# Patient Record
Sex: Female | Born: 1945 | Race: White | Hispanic: No | Smoking: Never smoker
Health system: Southern US, Community
[De-identification: ages and names within clinical notes are randomized; demographics above are authoritative.]

## PROBLEM LIST (undated history)

## (undated) DIAGNOSIS — E079 Disorder of thyroid, unspecified: Secondary | ICD-10-CM

## (undated) DIAGNOSIS — I10 Essential (primary) hypertension: Secondary | ICD-10-CM

## (undated) DIAGNOSIS — F419 Anxiety disorder, unspecified: Secondary | ICD-10-CM

## (undated) DIAGNOSIS — T7840XA Allergy, unspecified, initial encounter: Secondary | ICD-10-CM

## (undated) DIAGNOSIS — H269 Unspecified cataract: Secondary | ICD-10-CM

## (undated) DIAGNOSIS — M810 Age-related osteoporosis without current pathological fracture: Secondary | ICD-10-CM

## (undated) HISTORY — DX: Age-related osteoporosis without current pathological fracture: M81.0

## (undated) HISTORY — PX: EYE SURGERY: SHX253

## (undated) HISTORY — DX: Essential (primary) hypertension: I10

## (undated) HISTORY — DX: Unspecified cataract: H26.9

## (undated) HISTORY — DX: Allergy, unspecified, initial encounter: T78.40XA

## (undated) HISTORY — DX: Anxiety disorder, unspecified: F41.9

## (undated) HISTORY — DX: Disorder of thyroid, unspecified: E07.9

---

## 2010-10-21 ENCOUNTER — Encounter: Payer: Self-pay | Admitting: Gastroenterology

## 2016-06-24 ENCOUNTER — Other Ambulatory Visit: Payer: Self-pay | Admitting: Internal Medicine

## 2016-06-24 DIAGNOSIS — G8929 Other chronic pain: Secondary | ICD-10-CM

## 2016-06-24 DIAGNOSIS — M25512 Pain in left shoulder: Principal | ICD-10-CM

## 2016-06-24 DIAGNOSIS — M25511 Pain in right shoulder: Secondary | ICD-10-CM

## 2016-06-27 ENCOUNTER — Other Ambulatory Visit: Payer: Self-pay | Admitting: Internal Medicine

## 2016-06-27 DIAGNOSIS — M8580 Other specified disorders of bone density and structure, unspecified site: Secondary | ICD-10-CM

## 2016-07-01 ENCOUNTER — Ambulatory Visit
Admission: RE | Admit: 2016-07-01 | Discharge: 2016-07-01 | Disposition: A | Discharge status: Home / Self Care | Payer: Medicare Other | Source: Ambulatory Visit | Attending: Internal Medicine | Admitting: Internal Medicine

## 2016-07-01 ENCOUNTER — Other Ambulatory Visit: Payer: Self-pay | Admitting: Internal Medicine

## 2016-07-01 DIAGNOSIS — M79602 Pain in left arm: Secondary | ICD-10-CM

## 2016-07-01 DIAGNOSIS — M8580 Other specified disorders of bone density and structure, unspecified site: Secondary | ICD-10-CM

## 2016-07-01 DIAGNOSIS — G8929 Other chronic pain: Secondary | ICD-10-CM | POA: Diagnosis not present

## 2016-07-01 DIAGNOSIS — M79601 Pain in right arm: Secondary | ICD-10-CM | POA: Diagnosis not present

## 2016-07-01 DIAGNOSIS — Z780 Asymptomatic menopausal state: Secondary | ICD-10-CM | POA: Diagnosis not present

## 2016-07-01 DIAGNOSIS — M25512 Pain in left shoulder: Principal | ICD-10-CM

## 2016-07-01 DIAGNOSIS — M810 Age-related osteoporosis without current pathological fracture: Secondary | ICD-10-CM | POA: Insufficient documentation

## 2016-07-01 DIAGNOSIS — M25511 Pain in right shoulder: Secondary | ICD-10-CM | POA: Insufficient documentation

## 2016-07-16 DIAGNOSIS — C4492 Squamous cell carcinoma of skin, unspecified: Secondary | ICD-10-CM | POA: Insufficient documentation

## 2018-02-10 HISTORY — PX: CORNEAL TRANSPLANT: SHX108

## 2018-04-26 IMAGING — CR DG SHOULDER 2+V*L*
3 series · 3 of 3 positions shown · non-contrast
Comparison: None.

CLINICAL DATA: 70-year-old female bilateral shoulder pain for 6
months. No known injury. Pain proximal humerus extending into
biceps. Hurts to left arms. Clicking sensation left elbow up with
some pain right-side. Initial encounter.

EXAM:
LEFT SHOULDER - 2+ VIEW

[shoulder grashey]
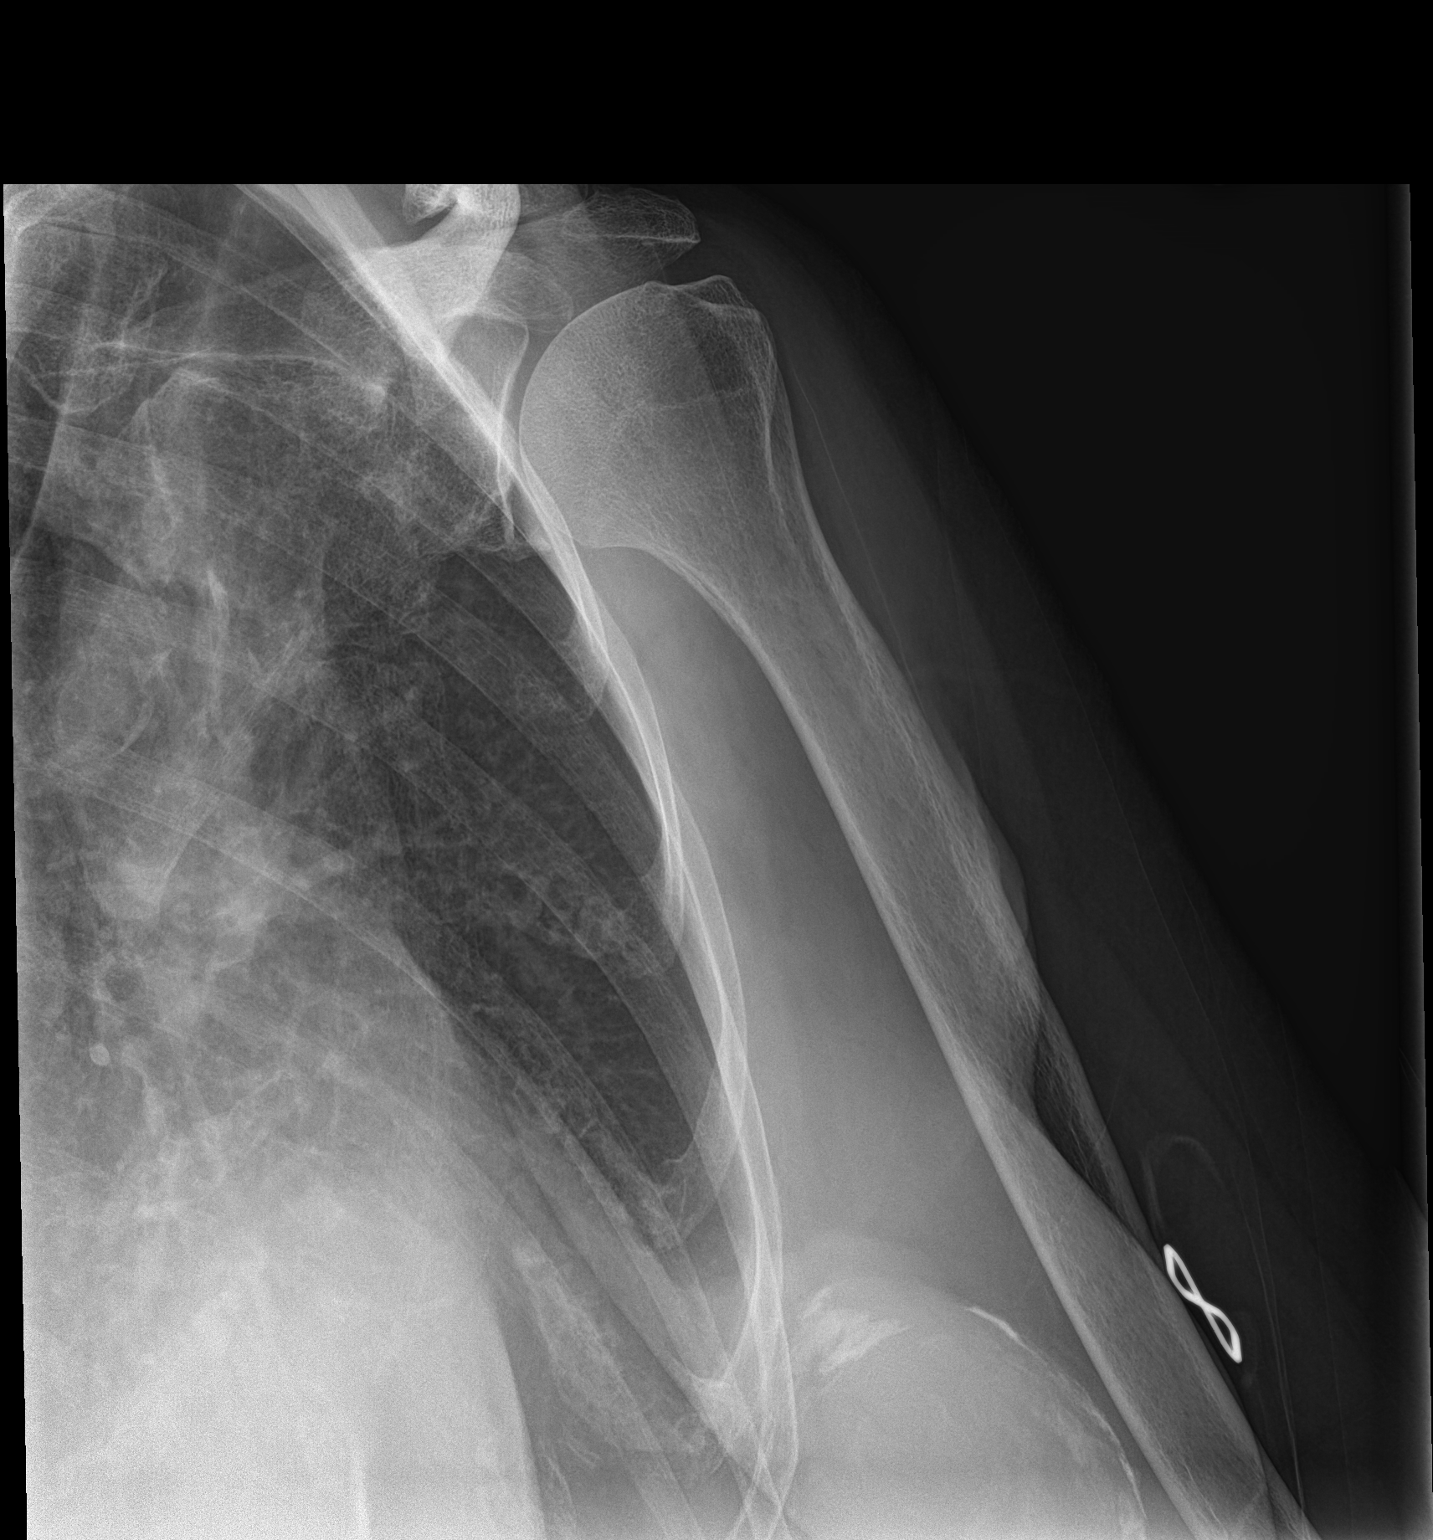

[shoulder y view]
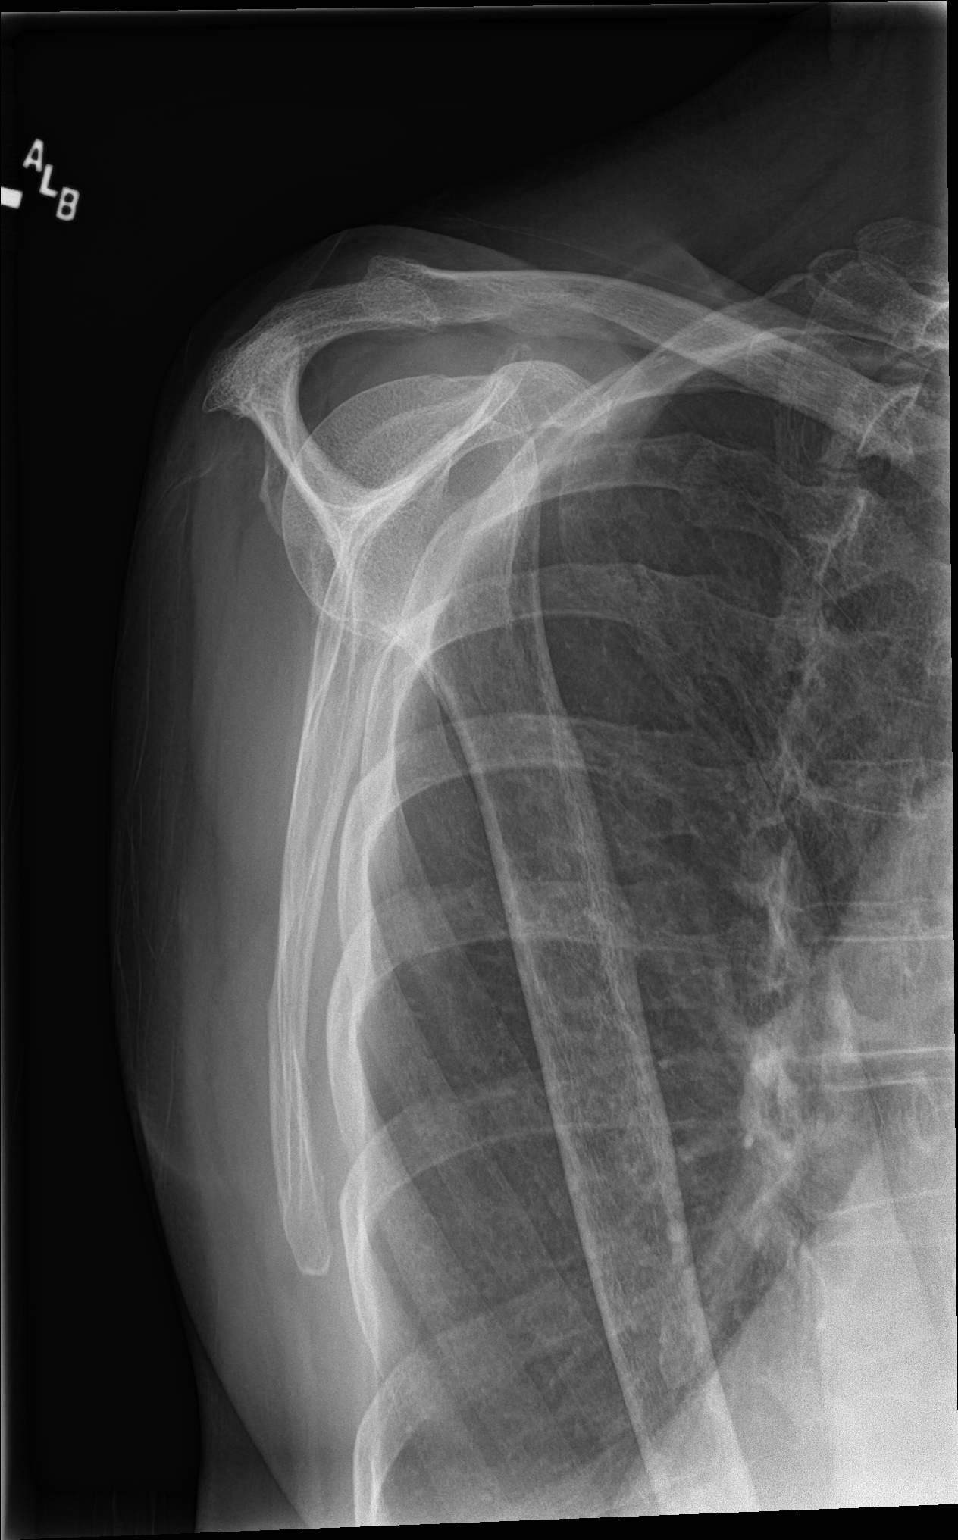

[shoulder axial]
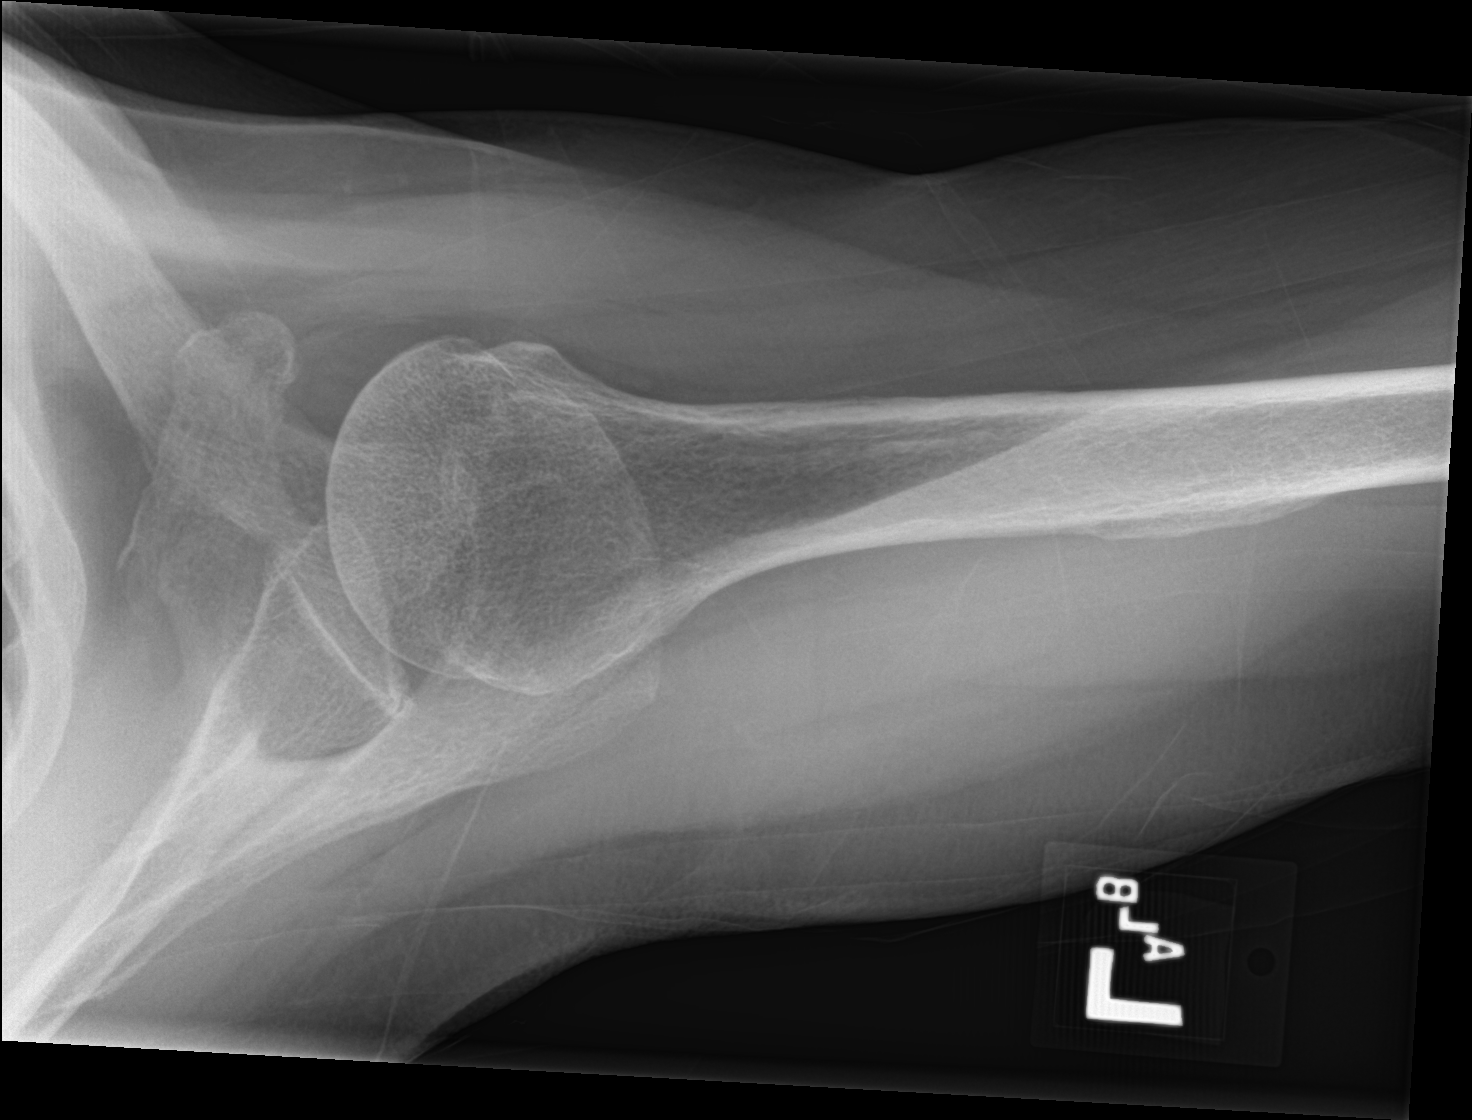

[3 of 3 positions shown; findings below may reference images not displayed]

FINDINGS: No fracture or dislocation.

Mild acromioclavicular joint degenerative changes.

No osseous destructive lesion.

No soft tissue calcifications shoulder level.

Calcified breast prosthesis suspected.

Visualized lungs clear.
IMPRESSION: Mild left acromioclavicular joint degenerative changes.

## 2018-04-26 IMAGING — CR DG ELBOW COMPLETE 3+V*L*
4 series · 4 of 4 positions shown · non-contrast
Comparison: None.

CLINICAL DATA: 70-year-old female bilateral shoulder pain for 6
months. No known injury. Pain proximal humerus extending into
biceps. Hurts to lift arms. Clicking sensation left elbow up with
some pain right-side. Initial encounter.

EXAM:
LEFT ELBOW - COMPLETE 3+ VIEW

[elbow ap]
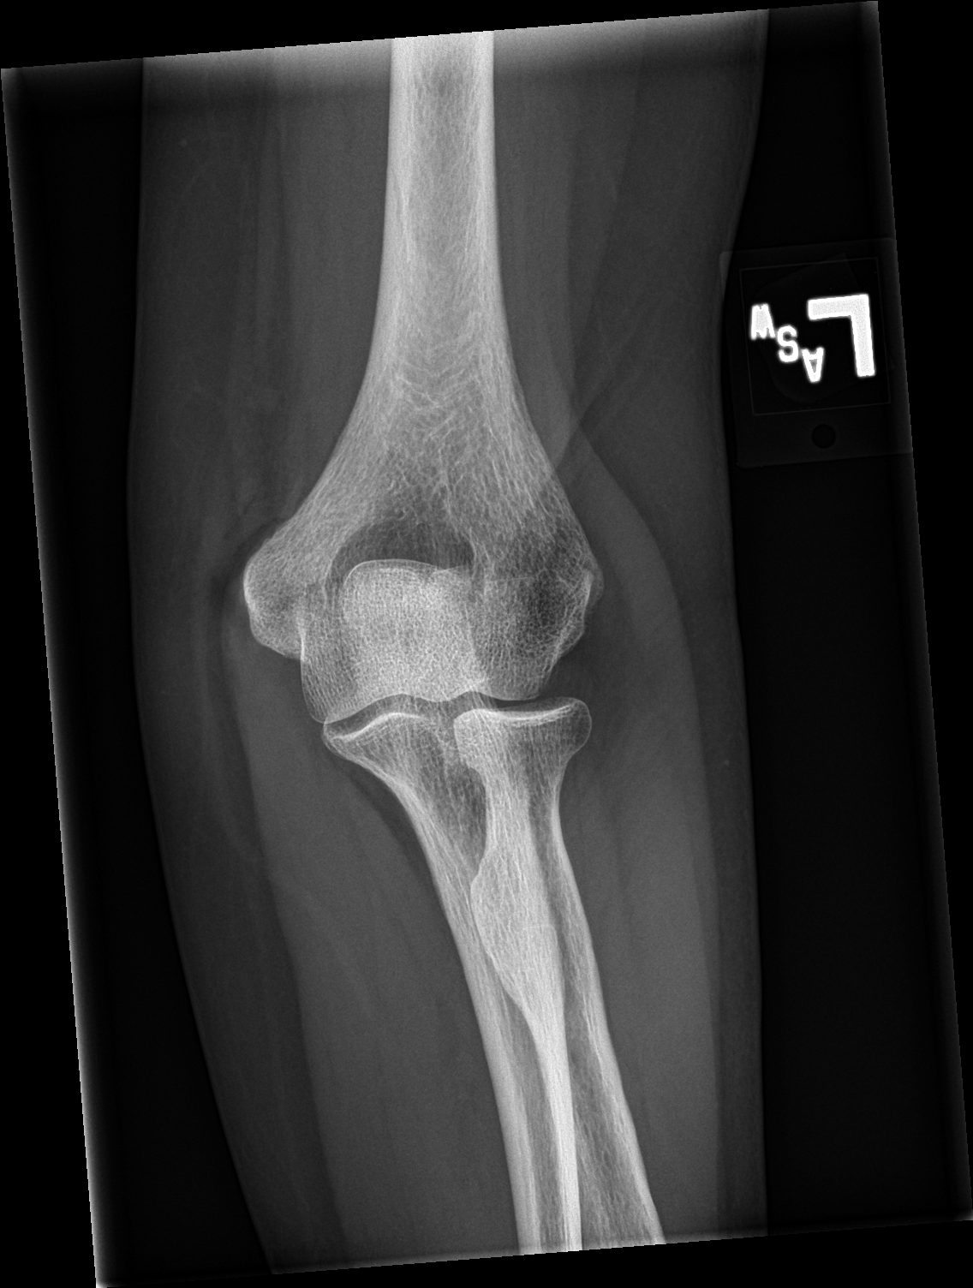

[elbow obl (1 of 2)]
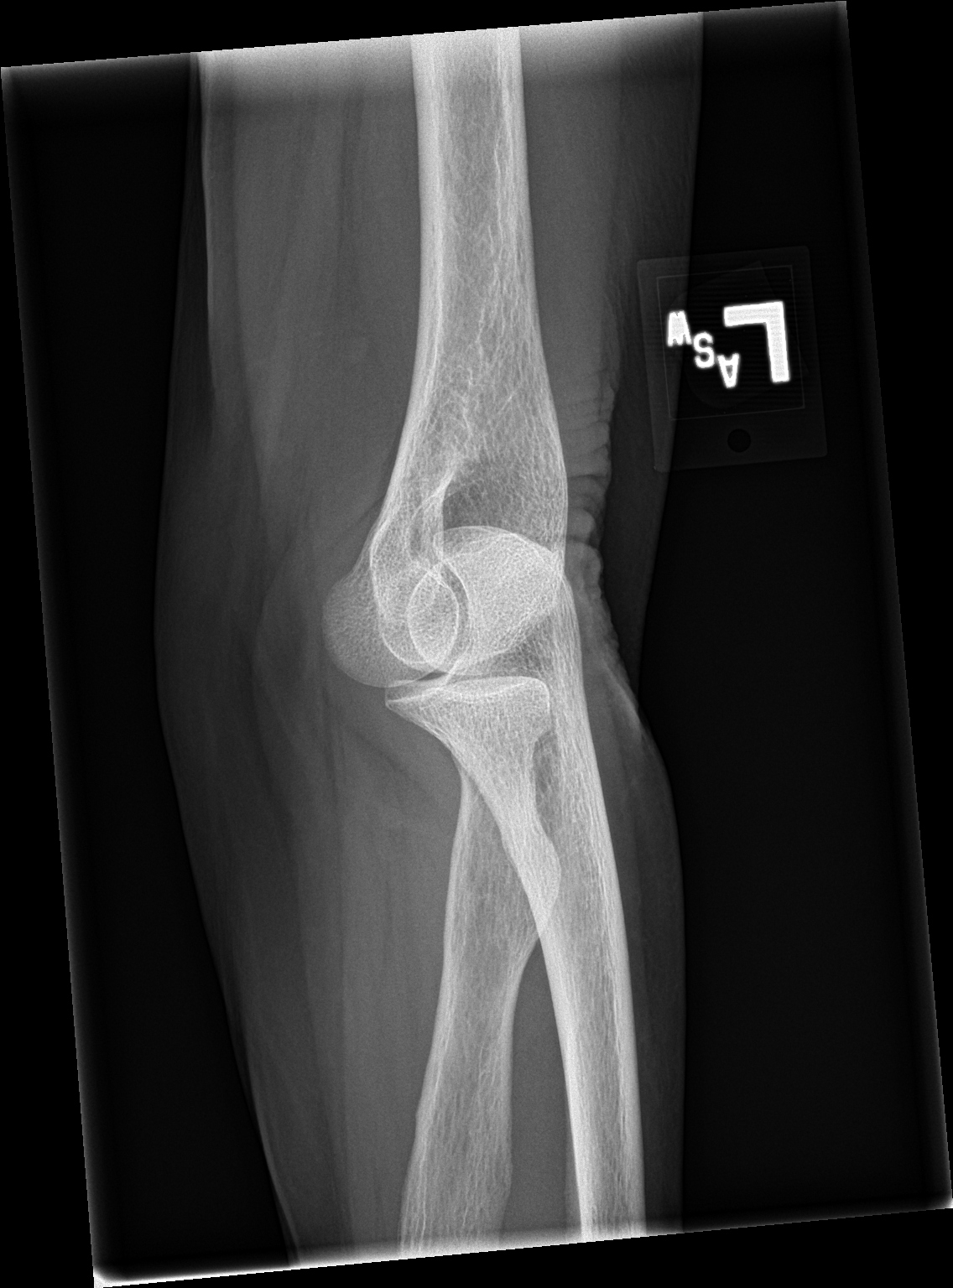

[elbow obl (2 of 2)]
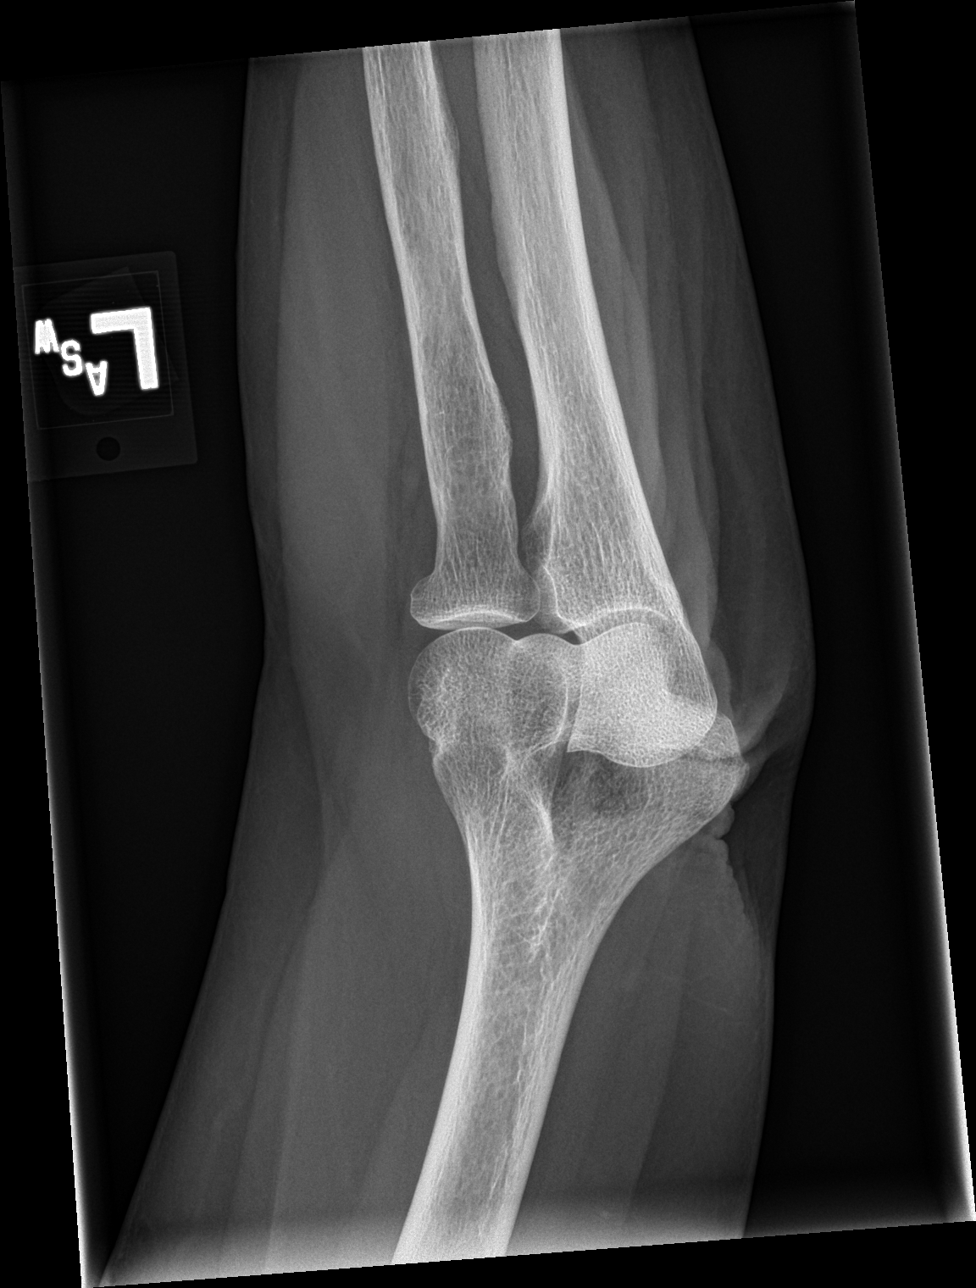

[elbow lat]
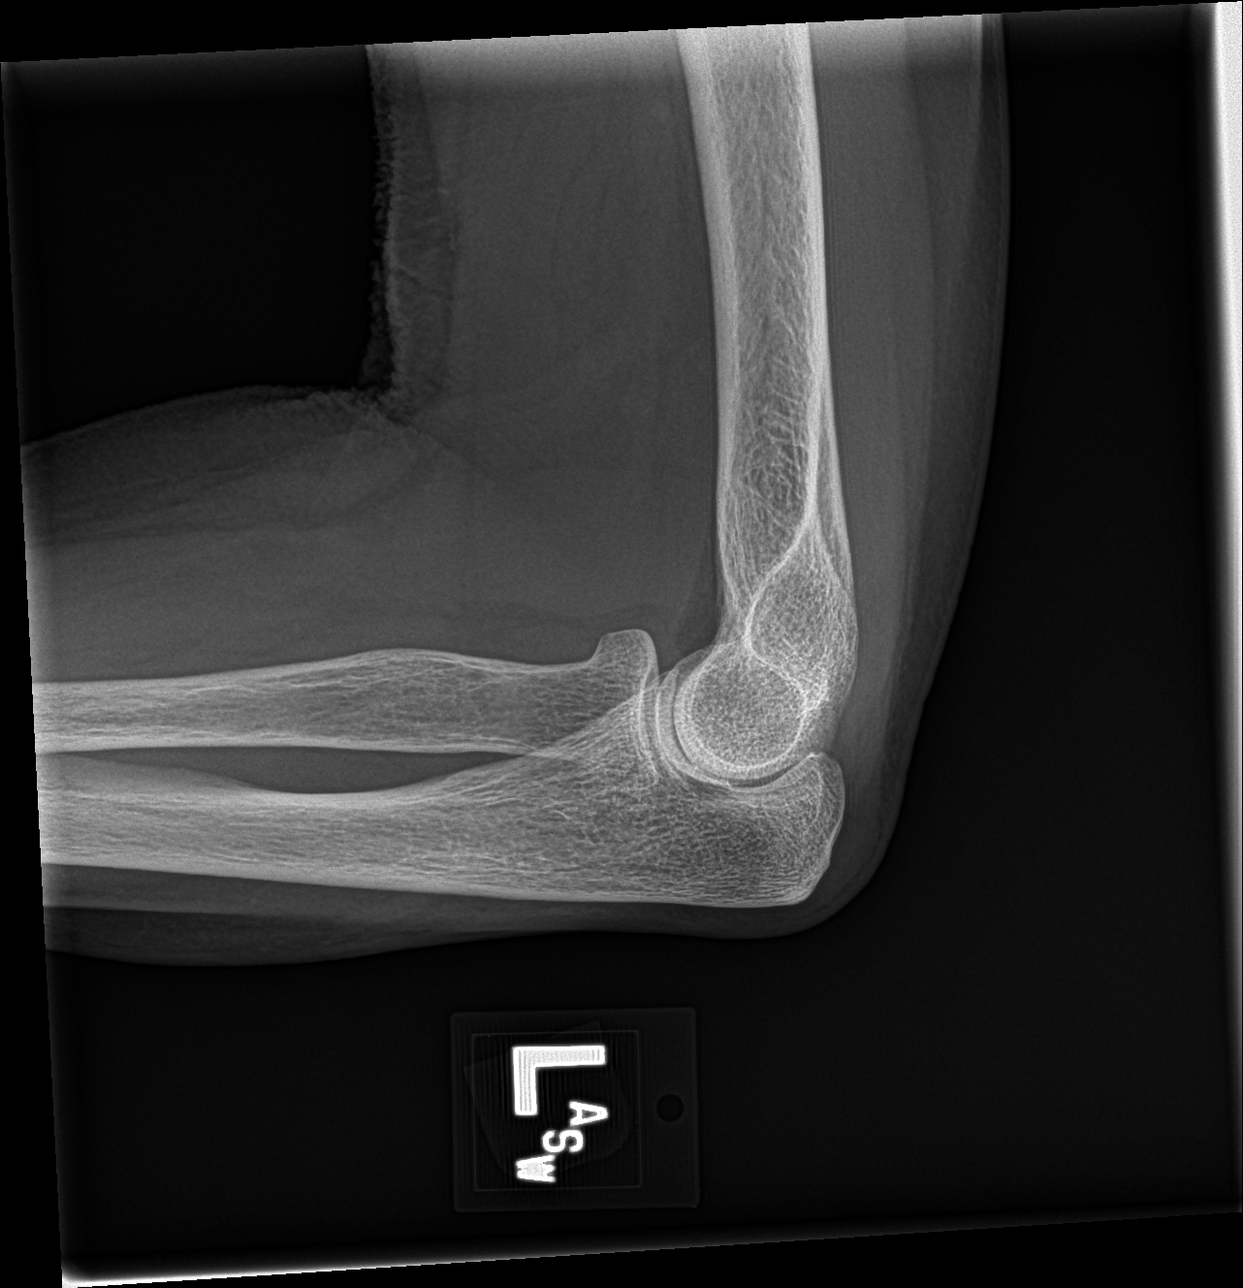

[4 of 4 positions shown; findings below may reference images not displayed]

FINDINGS: No fracture or dislocation.

Question small joint effusion as indicated by slightly prominent
anterior fat pad. No other findings to suggest arthropathy.
IMPRESSION: Question small joint effusion otherwise negative.

## 2018-11-12 DIAGNOSIS — E559 Vitamin D deficiency, unspecified: Secondary | ICD-10-CM | POA: Insufficient documentation

## 2018-11-12 DIAGNOSIS — M810 Age-related osteoporosis without current pathological fracture: Secondary | ICD-10-CM | POA: Insufficient documentation

## 2018-11-12 DIAGNOSIS — E039 Hypothyroidism, unspecified: Secondary | ICD-10-CM | POA: Insufficient documentation

## 2022-07-15 ENCOUNTER — Other Ambulatory Visit: Payer: Self-pay

## 2022-08-19 ENCOUNTER — Ambulatory Visit (INDEPENDENT_AMBULATORY_CARE_PROVIDER_SITE_OTHER): Payer: Medicare HMO | Admitting: Gastroenterology

## 2022-08-19 ENCOUNTER — Encounter: Payer: Self-pay | Admitting: Gastroenterology

## 2022-08-19 VITALS — BP 205/73 | HR 64 | Temp 98.1°F | Ht 63.0 in | Wt 125.0 lb

## 2022-08-19 DIAGNOSIS — R634 Abnormal weight loss: Secondary | ICD-10-CM

## 2022-08-19 NOTE — Patient Instructions (Signed)
Patient advised she may need to follow up regarding her elevated BP readings. Patient will recheck BP at home as she reports she gets lower readings at home. Denies any cardiac Sx

## 2022-08-19 NOTE — Progress Notes (Signed)
Gastroenterology Consultation  Referring Provider:     Lafe Garin, MD Primary Care Physician:  Lafe Garin, MD Primary Gastroenterologist:  Dr. Servando Snare     Reason for Consultation:     Incomplete evacuation        HPI:   Kim Cardenas is a 77 y.o. y/o female referred for consultation & management of incomplete evacuation by Dr. Polo Riley, Jonny Ruiz, MD. This patient comes in today after having a negative Cologuard back in 2021.  The patient had also called requesting a upper endoscopy also. She says she was 132 lbs last year and now she is 125 lbs. She is not as active. She sometimes has constipation without a bowel movement for 4 days but when she does she feels like she is not empty. The patient reports that she has episodes of where she will have significant amounts of diarrhea but only when she goes out to eat and this can last for months but then she will eat at the same restaurants without the diarrhea.  She thinks that it may be due to the oil on the grill because when she tells them to cook and tinfoil the food does not bother her.  She states that that has resolved but she also is concerned about any issues she may be having since she took Fosamax for some time and is worried about upper GI issues.  The patient denies any nausea vomiting fevers or chills.  No past medical history on file.    Prior to Admission medications   Medication Sig Start Date End Date Taking? Authorizing Provider  alendronate (FOSAMAX) 70 MG tablet Take 70 mg by mouth once a week. 11/04/21 11/04/22  [provider]  ALPRAZolam Prudy Feeler) 0.25 MG tablet Take 0.25 mg by mouth at bedtime as needed for sleep. 11/30/13   [provider]  ascorbic acid (VITAMIN C) 500 MG tablet Take 500 mg by mouth daily.    [provider]  aspirin EC 81 MG tablet Take 81 mg by mouth daily.    [provider]  chlorhexidine (PERIDEX) 0.12 % solution Use as directed 15 mLs in the mouth or throat  at bedtime.    [provider]  Cholecalciferol (VITAMIN D-1000 MAX ST) 25 MCG (1000 UT) tablet Take 1,000 Units by mouth daily.    [provider]  levothyroxine (SYNTHROID) 88 MCG tablet Take 88 mcg by mouth daily before breakfast. 11/04/21   [provider]  metoprolol succinate (TOPROL-XL) 100 MG 24 hr tablet Take 100 mg by mouth daily. 10/25/13   [provider]  prednisoLONE acetate (PRED FORTE) 1 % ophthalmic suspension Place 1 drop into the right eye every 3 (three) days. 04/08/22   [provider]  triamterene-hydrochlorothiazide (MAXZIDE-25) 37.5-25 MG tablet Take 1 tablet by mouth daily.    [provider]    No family history on file.   Social History   Tobacco Use   Smoking status: Never   Smokeless tobacco: Never  Substance Use Topics   Alcohol use: Yes    Comment: occasional   Drug use: Never    Allergies as of 08/19/2022 - Review Complete 08/19/2022  Allergen Reaction Noted   Erythromycin Other (See Comments) 01/22/2014    Review of Systems:    All systems reviewed and negative except where noted in HPI.   Physical Exam:  BP (!) 206/78 (BP Location: Left Arm, Patient Position: Sitting, Cuff Size: Normal)   Pulse 76   Temp  98.1 F (36.7 C) (Oral)   Ht 5\' 3"  (1.6 m)   Wt 125 lb (56.7 kg)   BMI 22.14 kg/m  No LMP recorded. Patient is postmenopausal. General:   Alert,  Well-developed, well-nourished, pleasant and cooperative in NAD Head:  Normocephalic and atraumatic. Eyes:  Sclera clear, no icterus.   Conjunctiva pink. Ears:  Normal auditory acuity. Neck:  Supple; no masses or thyromegaly. Rectal:  Deferred.  Neurologic:  Alert and oriented x3;  grossly normal neurologically. Psych:  Alert and cooperative. Normal mood and affect.  Imaging Studies: No results found.  Assessment and Plan:   Kim Cardenas is a 77 y.o. y/o female who comes in today with a report of needing a EGD and colonoscopy.  The  patient states that the colonoscopy is for screening and the upper endoscopy is because of her being on long-term Fosamax and she is slightly concerned about the weight loss.  The patient was told that she could be set up for the EGD and colonoscopy but she is concerned because she does not have a referral for the upper endoscopy and only for the colonoscopy. I have explained to her that the referral to me covers any procedures that I deem fit for her to undergo and that the colonoscopy as a screening test would be covered but the upper endoscopy may not.  The patient has been told to keep an eye on her weight loss and contact me if her weight continues to go down.  The patient insists on her primary care provider prior to setting up any procedures since she is concerned that it will not be covered without a referral from her PCP.  The patient has been told to contact me when she wants to set the procedures up.  The patient has been explained the plan and agrees with it.  Midge Minium, MD. Clementeen Graham    Note: This dictation was prepared with Dragon dictation along with smaller phrase technology. Any transcriptional errors that result from this process are unintentional.

## 2022-08-25 ENCOUNTER — Telehealth: Payer: Self-pay | Admitting: Gastroenterology

## 2022-08-25 NOTE — Telephone Encounter (Signed)
Patient and her husband Roe Coombs) called back in to ask for an activation code.

## 2022-08-25 NOTE — Telephone Encounter (Signed)
Setup Mychart needed activation code

## 2022-08-25 NOTE — Telephone Encounter (Signed)
Patient and her husband Roe Coombs) called in to signup Mychart. They are just going to come in and sign up.

## 2022-12-29 LAB — HM DEXA SCAN

## 2023-02-27 DIAGNOSIS — H0100A Unspecified blepharitis right eye, upper and lower eyelids: Secondary | ICD-10-CM | POA: Diagnosis not present

## 2023-02-27 DIAGNOSIS — H04123 Dry eye syndrome of bilateral lacrimal glands: Secondary | ICD-10-CM | POA: Diagnosis not present

## 2023-03-04 ENCOUNTER — Encounter (INDEPENDENT_AMBULATORY_CARE_PROVIDER_SITE_OTHER): Payer: Self-pay

## 2023-03-10 DIAGNOSIS — H1013 Acute atopic conjunctivitis, bilateral: Secondary | ICD-10-CM | POA: Diagnosis not present

## 2023-03-10 DIAGNOSIS — H04123 Dry eye syndrome of bilateral lacrimal glands: Secondary | ICD-10-CM | POA: Diagnosis not present

## 2023-03-10 DIAGNOSIS — H401111 Primary open-angle glaucoma, right eye, mild stage: Secondary | ICD-10-CM | POA: Diagnosis not present

## 2023-03-10 DIAGNOSIS — H18513 Endothelial corneal dystrophy, bilateral: Secondary | ICD-10-CM | POA: Diagnosis not present

## 2023-03-11 ENCOUNTER — Encounter: Payer: Self-pay | Admitting: Family Medicine

## 2023-03-11 ENCOUNTER — Ambulatory Visit (INDEPENDENT_AMBULATORY_CARE_PROVIDER_SITE_OTHER): Payer: HMO | Admitting: Family Medicine

## 2023-03-11 VITALS — BP 162/62 | HR 63 | Ht 61.5 in | Wt 123.0 lb

## 2023-03-11 DIAGNOSIS — E559 Vitamin D deficiency, unspecified: Secondary | ICD-10-CM

## 2023-03-11 DIAGNOSIS — I83892 Varicose veins of left lower extremities with other complications: Secondary | ICD-10-CM | POA: Diagnosis not present

## 2023-03-11 DIAGNOSIS — E039 Hypothyroidism, unspecified: Secondary | ICD-10-CM

## 2023-03-11 DIAGNOSIS — C4492 Squamous cell carcinoma of skin, unspecified: Secondary | ICD-10-CM

## 2023-03-11 DIAGNOSIS — I10 Essential (primary) hypertension: Secondary | ICD-10-CM | POA: Diagnosis not present

## 2023-03-11 DIAGNOSIS — M810 Age-related osteoporosis without current pathological fracture: Secondary | ICD-10-CM

## 2023-03-11 DIAGNOSIS — R2232 Localized swelling, mass and lump, left upper limb: Secondary | ICD-10-CM | POA: Diagnosis not present

## 2023-03-11 NOTE — Assessment & Plan Note (Signed)
Managed by an endocrinologist. Previously on alendronate, discontinued due to improved bone density. Recent DEXA scan shows osteopenia in the hip and osteoporosis in the forearm. - Continue vitamin D 1000 mg daily. - Follow up with endocrinologist and repeat DEXA scan in two years.

## 2023-03-11 NOTE — Assessment & Plan Note (Signed)
Managed with Synthroid 88 mcg daily. Recent TSH was 2.7 in November, indicating well-managed thyroid function. Chronic - Continue Synthroid 88 mcg daily. - Follow up with endocrinologist as needed.

## 2023-03-11 NOTE — Assessment & Plan Note (Signed)
Chronic Hypertension is not well-controlled with an office reading of 174/63. Home readings average 133/unknown, suggesting possible white coat hypertension. Current medications: amlodipine 5 mg daily, metoprolol 100 mg daily, triamterene 37.5 mg, and hydrochlorothiazide 25 mg daily. Reduced triamterene due to urinary symptoms. Discussed potential medication adjustments if home readings remain high. - Monitor blood pressure at home daily for one month, recording readings one hour after morning medications. - Reevaluate blood pressure control in one month. - Consider adjusting medication regimen if home readings are consistently high.

## 2023-03-11 NOTE — Assessment & Plan Note (Signed)
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 Referral to a vascular specialist pending. Discussed potential treatments such as laser therapy. - Call vascular specialist to schedule an appointment. Previously referred to vascular specialist

## 2023-03-11 NOTE — Assessment & Plan Note (Signed)
Hard, non-painful mass on the left forearm, possibly a bone spur or calcium deposit. No recent injury reported. Discussed x-ray for further evaluation. - Order x-ray of the left forearm.

## 2023-03-11 NOTE — Patient Instructions (Addendum)
Please report to [facility] located at:  [address]  [place], [place] [address]  You do not need an appointment to have xrays completed.   Our office will follow up with  results once available.  VISIT SUMMARY:  Today, we discussed several health concerns including your blood pressure, a mass on your left forearm, varicose veins, hypothyroidism, osteoporosis, and gout. We reviewed your current medications and made plans for further evaluation and follow-up.  YOUR PLAN:  -HYPERTENSION: Hypertension means high blood pressure. Your blood pressure was high in the office today, but your home readings are usually lower. We will monitor your blood pressure at home daily for one month and then reevaluate. If your home readings are still high, we may adjust your medications.  -LEFT FOREARM MASS: You have a hard, non-painful lump on your left forearm, which could be a bone spur or calcium deposit. We will get an x-ray to learn more about it.  -VARICOSE VEINS: Varicose veins are enlarged veins that can cause discomfort. You have a referral to see a vascular specialist to discuss treatment options like laser therapy. Please schedule this appointment soon.  -HYPOTHYROIDISM: Hypothyroidism means your thyroid gland is underactive. Your thyroid function is well-managed with your current medication, Synthroid 88 mcg daily. Continue taking this medication and follow up with your endocrinologist as needed.  -OSTEOPOROSIS: Osteoporosis is a condition where bones become weak and brittle. Your recent DEXA scan shows osteopenia in your hip and osteoporosis in your forearm. Continue taking vitamin D 1000 mg daily and follow up with your endocrinologist. We will repeat the DEXA scan in two years.  -GOUT: Gout is a type of arthritis that causes painful joint inflammation. You had a flare-up in the past but have not had any recent issues. Monitor for new flares, and we may  consider a lower dose of allopurinol if they occur.  -GENERAL HEALTH MAINTENANCE: We need to update your vaccine records and schedule a colonoscopy for colon cancer screening. These are important for your overall health.  INSTRUCTIONS:  Please follow up in one month to review your blood pressure readings. Also, follow up with the vascular specialist for your varicose veins and with your endocrinologist for osteoporosis and hypothyroidism. Don't forget to update your vaccine records and schedule a colonoscopy for colon cancer screening.

## 2023-03-11 NOTE — Progress Notes (Signed)
New patient visit   Patient: Kim Cardenas   DOB: [birth year]   78 y.o. Female  MRN: [id number] Visit Date: 03/11/2023  Today's healthcare provider: Ronnald Ramp, MD   Chief Complaint  Patient presents with   New Patient (Initial Visit)    establish   Establish Care   Subjective    Kim Cardenas is a 78 y.o. female who presents today as a new patient to establish care.   HPI     New Patient (Initial Visit)    Additional comments: establish      Last edited by Ronnald Ramp, MD on 03/11/2023  1:24 PM.       Discussed the use of AI scribe software for clinical note transcription with the patient, who gave verbal consent to proceed.  History of Present Illness   The patient is a 78 year old female with hypertension who presents to establish care.  She has a history of hypertension and notes that her blood pressure was elevated today at 174/63, attributing this to 'white coat syndrome' as her home readings are typically around 133. She is currently on amlodipine 5 mg daily, metoprolol 100 mg daily, triamterene 37.5 mg, and hydrochlorothiazide 25 mg daily. She takes half of the triamterene due to urinary urgency issues. She recalls a previous blood pressure reading of 205/73 and is concerned about the accuracy of home monitors compared to manual readings.  She has a history of hypothyroidism and is on Synthroid 88 mcg daily. Her TSH level was 2.7 in November, indicating stable thyroid function.  She has a history of vitamin D deficiency and takes 1000 mg of vitamin D daily. She was previously on alendronate for osteoporosis but was taken off after two years due to improved bone density results. Her last DEXA scan indicated osteoporosis in the forearm and osteopenia in the hip.  She experienced a gout flare following a COVID-19 infection in August, which was treated with colchicine. She was initially prescribed allopurinol 300 mg but discontinued it  due to severe headaches. She has not had further gout flares since then. She mentions a family history of gout, as her father had it.  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 She has a referral for a vascular evaluation but has not yet scheduled the appointment.  No current gout flares. Reports a hard, painless lump on the left forearm. Describes a red spot and discomfort in the left leg.          Past Medical History:  Diagnosis Date   Allergy    Anxiety    Cataract    Hypertension    Osteoporosis    Thyroid disease     Outpatient Medications Prior to Visit  Medication Sig   ALPRAZolam (XANAX) 0.25 MG tablet Take 0.25 mg by mouth at bedtime as needed for sleep.   amLODipine (NORVASC) 2.5 MG tablet Take 5 mg by mouth daily.   ascorbic acid (VITAMIN C) 500 MG tablet Take 500 mg by mouth daily.   aspirin EC 81 MG tablet Take 81 mg by mouth daily.   brimonidine-timolol (COMBIGAN) 0.2-0.5 % ophthalmic solution Place 1 drop into both eyes every 12 (twelve) hours.   chlorhexidine (PERIDEX) 0.12 % solution Use as directed 15 mLs in the mouth or throat at bedtime.   levothyroxine (SYNTHROID) 88 MCG  tablet Take 88 mcg by mouth daily before breakfast.   metoprolol succinate (TOPROL-XL) 100 MG 24 hr tablet Take 100 mg by mouth daily.   prednisoLONE acetate (PRED FORTE) 1 % ophthalmic suspension Place 1 drop into the right eye every 3 (three) days.   triamterene-hydrochlorothiazide (MAXZIDE-25) 37.5-25 MG tablet Take 1 tablet by mouth daily. Taking 0.5 tablet.   Cholecalciferol (VITAMIN D-1000 MAX ST) 25 MCG (1000 UT) tablet Take 1,000 Units by mouth daily. (Patient not taking: Reported on 03/11/2023)   No facility-administered medications prior to visit.    Past Surgical History:  Procedure Laterality Date   CORNEAL TRANSPLANT Right 2020   EYE SURGERY  Bilateral    Family Status  Relation Name Status   Mother  (Not Specified)   Father  (Not Specified)  No partnership data on file   Family History  Problem Relation Age of Onset   Heart Problems Mother    Lung cancer Father    Social History   Socioeconomic History   Marital status: Single    Spouse name: Not on file   Number of children: Not on file   Years of education: Not on file   Highest education level: Not on file  Occupational History   Not on file  Tobacco Use   Smoking status: Never   Smokeless tobacco: Never  Substance and Sexual Activity   Alcohol use: Yes    Comment: occasional   Drug use: Never   Sexual activity: Not on file  Other Topics Concern   Not on file  Social History Narrative   Not on file   Social Drivers of Health   Financial Resource Strain: Not on file  Food Insecurity: Not on file  Transportation Needs: Not on file  Physical Activity: Not on file  Stress: Not on file  Social Connections: Not on file     Allergies  Allergen Reactions   Erythromycin Other (See Comments)    Other Reaction(s): GI Intolerance    Immunization History  Administered Date(s) Administered   Fluad Quad(high Dose 65+) 11/28/2022   Moderna Covid-19 Fall Seasonal Vaccine 29yrs & older 12/30/2021   Zoster, Unspecified 04/23/2017    Health Maintenance  Topic Date Due   Medicare Annual Wellness (AWV)  Never done   Hepatitis C Screening  Never done   DTaP/Tdap/Td (1 - Tdap) Never done   Zoster Vaccines- Shingrix (1 of 2) 01/12/1967   Colonoscopy  Never done   Pneumonia Vaccine 66+ Years old (1 of 1 - PCV) Never done   COVID-19 Vaccine (2 - Moderna risk series) 01/27/2022   INFLUENZA VACCINE  Completed   DEXA SCAN  Completed   HPV VACCINES  Aged Out    Patient Care Team: Ronnald Ramp, MD as PCP - General (Family Medicine) Solum, Marlana Salvage, MD as Physician Assistant (Endocrinology)  Review of Systems  Last CBC No results found for:  "WBC", "HGB", "HCT", "MCV", "MCH", "RDW", "PLT" Last metabolic panel No results found for: "GLUCOSE", "NA", "K", "CL", "CO2", "BUN", "CREATININE", "EGFR", "CALCIUM", "PHOS", "PROT", "ALBUMIN", "LABGLOB", "AGRATIO", "BILITOT", "ALKPHOS", "AST", "ALT", "ANIONGAP" Last lipids No results found for: "CHOL", "HDL", "LDLCALC", "LDLDIRECT", "TRIG", "CHOLHDL" Last hemoglobin A1c No results found for: "HGBA1C" Last thyroid functions No results found for: "TSH", "T3TOTAL", "T4TOTAL", "THYROIDAB"      Objective    BP (!) 162/62 (BP Location: Left Arm, Patient Position: Sitting, Cuff Size: Normal)   Pulse 63   Ht 5' 1.5" (1.562 m)   Wt 123  lb (55.8 kg)   SpO2 98%   BMI 22.86 kg/m  BP Readings from Last 3 Encounters:  03/11/23 (!) 162/62  08/19/22 (!) 205/73   Wt Readings from Last 3 Encounters:  03/11/23 123 lb (55.8 kg)  08/19/22 125 lb (56.7 kg)        Depression Screen    03/11/2023    1:08 PM  PHQ 2/9 Scores  PHQ - 2 Score 0  PHQ- 9 Score 3   No results found for any visits on 03/11/23.   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 Referral to a vascular specialist pending. Discussed potential treatments such as laser therapy. - Call vascular specialist to schedule an appointment. Previously referred to vascular specialist      Relevant Medications   amLODipine (NORVASC) 2.5 MG tablet   Primary hypertension - Primary   Chronic Hypertension is not well-controlled with an office reading of 174/63. Home readings average 133/unknown, suggesting possible white coat hypertension. Current  medications: amlodipine 5 mg daily, metoprolol 100 mg daily, triamterene 37.5 mg, and hydrochlorothiazide 25 mg daily. Reduced triamterene due to urinary symptoms. Discussed potential medication adjustments if home readings remain high. - Monitor blood pressure at home daily for one month, recording readings one hour after morning medications. - Reevaluate blood pressure control in one month. - Consider adjusting medication regimen if home readings are consistently high.      Relevant Medications   amLODipine (NORVASC) 2.5 MG tablet     Endocrine   Hypothyroidism, acquired   Managed with Synthroid 88 mcg daily. Recent TSH was 2.7 in November, indicating well-managed thyroid function. Chronic - Continue Synthroid 88 mcg daily. - Follow up with endocrinologist as needed.        Musculoskeletal and Integument   Squamous cell skin cancer   Osteoporosis, post-menopausal   Managed by an endocrinologist. Previously on alendronate, discontinued due to improved bone density. Recent DEXA scan shows osteopenia in the hip and osteoporosis in the forearm. - Continue vitamin D 1000 mg daily. - Follow up with endocrinologist and repeat DEXA scan in two years.      Nodule of skin of left forearm   Hard, non-painful mass on the left forearm, possibly a bone spur or calcium deposit. No recent injury reported. Discussed x-ray for further evaluation. - Order x-ray of the left forearm.      Relevant Orders   DG Forearm Left     Other   Vitamin D insufficiency         Gout Previous episode treated with colchicine. Discontinued allopurinol 300 mg due to adverse effects. No recent flares reported. - Monitor for new gout flares. - Consider lower dose allopurinol if future flares occur.  General Health Maintenance Needs to update vaccine records and complete a colonoscopy for colon cancer screening. - Update vaccine records. - Schedule colonoscopy for colon cancer  screening.        Return in about 1 month (around 04/10/2023) for HTN.      Ronnald Ramp, MD  [facility] [phone]  phone) [phone] (fax)  [facility]

## 2023-03-12 ENCOUNTER — Ambulatory Visit
Admission: RE | Admit: 2023-03-12 | Discharge: 2023-03-12 | Disposition: A | Discharge status: Home / Self Care | Payer: HMO | Attending: Family Medicine | Admitting: Family Medicine

## 2023-03-12 ENCOUNTER — Ambulatory Visit
Admission: RE | Admit: 2023-03-12 | Discharge: 2023-03-12 | Disposition: A | Discharge status: Home / Self Care | Payer: HMO | Source: Ambulatory Visit | Attending: Family Medicine | Admitting: Family Medicine

## 2023-03-12 DIAGNOSIS — R2232 Localized swelling, mass and lump, left upper limb: Secondary | ICD-10-CM

## 2023-03-25 ENCOUNTER — Encounter: Payer: Self-pay | Admitting: Family Medicine

## 2023-03-26 ENCOUNTER — Encounter (INDEPENDENT_AMBULATORY_CARE_PROVIDER_SITE_OTHER): Payer: Self-pay

## 2023-03-30 DIAGNOSIS — H18513 Endothelial corneal dystrophy, bilateral: Secondary | ICD-10-CM | POA: Diagnosis not present

## 2023-03-30 DIAGNOSIS — H1013 Acute atopic conjunctivitis, bilateral: Secondary | ICD-10-CM | POA: Diagnosis not present

## 2023-03-30 DIAGNOSIS — H401111 Primary open-angle glaucoma, right eye, mild stage: Secondary | ICD-10-CM | POA: Diagnosis not present

## 2023-03-30 DIAGNOSIS — H40002 Preglaucoma, unspecified, left eye: Secondary | ICD-10-CM | POA: Diagnosis not present

## 2023-04-07 ENCOUNTER — Encounter (INDEPENDENT_AMBULATORY_CARE_PROVIDER_SITE_OTHER): Payer: Self-pay | Admitting: Vascular Surgery

## 2023-04-07 ENCOUNTER — Ambulatory Visit (INDEPENDENT_AMBULATORY_CARE_PROVIDER_SITE_OTHER): Payer: HMO | Admitting: Vascular Surgery

## 2023-04-07 VITALS — BP 187/70 | HR 60 | Resp 18 | Ht 61.0 in | Wt 123.4 lb

## 2023-04-07 DIAGNOSIS — I10 Essential (primary) hypertension: Secondary | ICD-10-CM | POA: Diagnosis not present

## 2023-04-07 DIAGNOSIS — I83892 Varicose veins of left lower extremities with other complications: Secondary | ICD-10-CM | POA: Diagnosis not present

## 2023-04-07 NOTE — Progress Notes (Signed)
 Patient ID: Kim Cardenas, female   DOB: Oct 02, 1945, 78 y.o.   MRN: [id number]  Chief Complaint  Patient presents with   New Patient (Initial Visit)    np. consult. Asymptomatic varicose veins of unspecified lower extremity. Lafe Garin.    HPI Kim Cardenas is a 78 y.o. female.  I am asked to see the patient by dr. Polo Riley for evaluation of varicose veins of both lower extremities.  The patient presents with complaints of symptomatic varicosities of the legs. The patient reports a long standing history of varicosities and they have become painful over time. There was no clear inciting event or causative factor that started the symptoms.  The left leg is more severly affected in terms of the stinging and burning from larger varicosities in the left lateral lower leg and behind the knee. The patient elevates the legs for relief. The pain is described as stinging and burning.  She also has a lot of itching overlying the varicosities. The symptoms are generally most severe in the evening, particularly when they have been on their feet for long periods of time.  Elevation has been used to try to improve the symptoms with limited success. The patient complains of more noticeable swelling as an associated symptom.  This seems to be more prominent on the right leg and the left leg.  The patient has no previous history of deep venous thrombosis or superficial thrombophlebitis to their knowledge.  She has had a previous history of sclerotherapy to both lower extremities many years ago but says that the veins really never went away.     Past Medical History:  Diagnosis Date   Allergy    Anxiety    Cataract    Hypertension    Osteoporosis    Thyroid disease     Past Surgical History:  Procedure Laterality Date   CORNEAL TRANSPLANT Right 2020   EYE SURGERY Bilateral     Family History  Problem Relation Age of Onset   Heart Problems Mother    Lung cancer Father   No bleeding or  clotting disorders   Social History   Tobacco Use   Smoking status: Never   Smokeless tobacco: Never  Substance Use Topics   Alcohol use: Yes    Comment: occasional   Drug use: Never     Allergies  Allergen Reactions   Allopurinol     Headache    Erythromycin Other (See Comments)    Other Reaction(s): GI Intolerance    Current Outpatient Medications  Medication Sig Dispense Refill   ALPRAZolam (XANAX) 0.25 MG tablet Take 0.25 mg by mouth at bedtime as needed for sleep.     amLODipine (NORVASC) 2.5 MG tablet Take 5 mg by mouth daily.     ascorbic acid (VITAMIN C) 500 MG tablet Take 500 mg by mouth daily.     aspirin EC 81 MG tablet Take 81 mg by mouth daily.     brimonidine-timolol (COMBIGAN) 0.2-0.5 % ophthalmic solution Place 1 drop into both eyes every 12 (twelve) hours.     chlorhexidine (PERIDEX) 0.12 % solution Use as directed 15 mLs in the mouth or throat at bedtime.     Cholecalciferol (VITAMIN D-1000 MAX ST) 25 MCG (1000 UT) tablet Take 1,000 Units by mouth daily.     levothyroxine (SYNTHROID) 88 MCG tablet Take 88 mcg by mouth daily before breakfast.     loratadine (CLARITIN) 10 MG tablet Take 10 mg by mouth daily.  metoprolol succinate (TOPROL-XL) 100 MG 24 hr tablet Take 100 mg by mouth daily.     prednisoLONE acetate (PRED FORTE) 1 % ophthalmic suspension Place 1 drop into the right eye every 3 (three) days.     triamterene-hydrochlorothiazide (MAXZIDE-25) 37.5-25 MG tablet Take 1 tablet by mouth daily. Taking 0.5 tablet.     No current facility-administered medications for this visit.      REVIEW OF SYSTEMS (Negative unless checked)  Constitutional: [] Weight loss  [] Fever  [] Chills Cardiac: [] Chest pain   [] Chest pressure   [] Palpitations   [] Shortness of breath when laying flat   [] Shortness of breath at rest   [] Shortness of breath with exertion. Vascular:  [] Pain in legs with walking   [] Pain in legs at rest   [] Pain in legs when laying flat    [] Claudication   [] Pain in feet when walking  [] Pain in feet at rest  [] Pain in feet when laying flat   [] History of DVT   [] Phlebitis   [x] Swelling in legs   [x] Varicose veins   [] Non-healing ulcers Pulmonary:   [] Uses home oxygen   [] Productive cough   [] Hemoptysis   [] Wheeze  [] COPD   [] Asthma Neurologic:  [] Dizziness  [] Blackouts   [] Seizures   [] History of stroke   [] History of TIA  [] Aphasia   [] Temporary blindness   [] Dysphagia   [] Weakness or numbness in arms   [] Weakness or numbness in legs Musculoskeletal:  [] Arthritis   [] Joint swelling   [] Joint pain   [] Low back pain Hematologic:  [] Easy bruising  [] Easy bleeding   [] Hypercoagulable state   [] Anemic  [] Hepatitis Gastrointestinal:  [] Blood in stool   [] Vomiting blood  [] Gastroesophageal reflux/heartburn   [] Abdominal pain Genitourinary:  [] Chronic kidney disease   [] Difficult urination  [] Frequent urination  [] Burning with urination   [] Hematuria Skin:  [] Rashes   [] Ulcers   [] Wounds Psychological:  [x] History of anxiety   []  History of major depression.    Physical Exam BP (!) 187/70   Pulse 60   Resp 18   Ht 5\' 1"  (1.549 m)   Wt 123 lb 6.4 oz (56 kg)   BMI 23.32 kg/m  Gen:  WD/WN, NAD. Appears younger than stated age. Head: [place]/AT, No temporalis wasting.  Ear/Nose/Throat: Hearing grossly intact, dentition good Eyes: Sclera non-icteric. Conjunctiva clear Neck: Supple. Trachea midline Pulmonary:  Good air movement, no use of accessory muscles, respirations not labored.  Cardiac: RRR, No JVD Vascular: Varicosities diffuse and measuring up to 1-2 mm in the right lower extremity        Varicosities extensive and measuring up to 1-2 mm in the left lower extremity Vessel Right Left  Radial Palpable Palpable                          PT Palpable Palpable  DP Palpable Palpable   Gastrointestinal: soft, non-tender/non-distended.  Musculoskeletal: M/S 5/5 throughout.   1+ + RLE edema.  trace LLE edema Neurologic: Sensation  grossly intact in extremities.  Symmetrical.  Speech is fluent.  Psychiatric: Judgment intact, Mood & affect appropriate for pt's clinical situation. Dermatologic: No rashes or ulcers noted.  No cellulitis or open wounds.    Radiology DG Forearm Left Result Date: 03/25/2023 CLINICAL DATA:  78 year old female with left forearm palpable abnormality for 6 months. Nonpainful, no known injury. EXAM: LEFT FOREARM - 2 VIEW COMPARISON:  Previous elbow series but not including the distal forearm. FINDINGS: Two views on 03/12/2023 with  marked area of concern along the distal radial styloid lateral and volar. On the AP view a small 4 mm benign appearing exostosis of the styloid is noted with no suspicious bony features. No periosteal reaction. Background bone mineralization is within normal limits. Maintained alignment at the elbow and wrist. No fracture, dislocation, acute osseous abnormality identified. Soft tissue contours appear within normal limits. IMPRESSION: 1. Palpable abnormality appears to correspond to a benign-appearing 4 mm exostosis of the distal radial styloid. Recommend follow-up by clinical exam, with repeat radiographs if the area enlarges or becomes painful. 2. Otherwise negative for age radiographic appearance of the left forearm. Electronically Signed   By: Odessa Fleming M.D.   On: 03/25/2023 09:56    Labs No results found for this or any previous visit (from the past 2160 hours).  Assessment/Plan:  Primary hypertension blood pressure control important in reducing the progression of atherosclerotic disease. On appropriate oral medications.   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 We discussed the natural history and treatment options for venous disease. I recommended the regular use of 20 - 30 mm Hg compression stockings, and prescribed these today. I recommended leg elevation and  anti-inflammatories as needed for pain. I have also recommended a complete venous duplex to assess the venous system for reflux or thrombotic issues. This can be done at the patient's convenience. I will see the patient back with her duplex to assess the response to conservative management, and determine further treatment options.     Festus Barren 04/07/2023, 4:23 PM   This note was created with Dragon medical transcription system.  Any errors from dictation are unintentional.

## 2023-04-07 NOTE — Assessment & Plan Note (Signed)
 blood pressure control important in reducing the progression of atherosclerotic disease. On appropriate oral medications.

## 2023-04-07 NOTE — Patient Instructions (Signed)

## 2023-04-14 ENCOUNTER — Encounter: Payer: Self-pay | Admitting: Family Medicine

## 2023-04-14 ENCOUNTER — Ambulatory Visit: Payer: Self-pay | Admitting: Family Medicine

## 2023-04-14 VITALS — BP 130/64 | HR 60 | Ht 61.0 in | Wt 122.0 lb

## 2023-04-14 DIAGNOSIS — I10 Essential (primary) hypertension: Secondary | ICD-10-CM | POA: Diagnosis not present

## 2023-04-14 MED ORDER — AMLODIPINE BESYLATE 5 MG PO TABS: 5.0000 mg | ORAL_TABLET | Freq: Every day | ORAL | Status: DC

## 2023-04-14 NOTE — Patient Instructions (Signed)
 Kim Cardenas

## 2023-04-14 NOTE — Progress Notes (Signed)
 Established patient visit   Patient: Kim Cardenas   DOB: 05/03/1945   78 y.o. Female  MRN: [id number] Visit Date: 04/14/2023  Today's healthcare provider: Ronnald Ramp, MD   Chief Complaint  Patient presents with   Hypertension    Bp recheck    Subjective     HPI     Hypertension    Additional comments: Bp recheck       Last edited by Thedora Hinders, CMA on 04/14/2023 11:27 AM.       Discussed the use of AI scribe software for clinical note transcription with the patient, who gave verbal consent to proceed.  History of Present Illness   Kim Cardenas is a 78 year old female with chronic hypertension who presents with elevated blood pressure readings.  She has experienced elevated blood pressure readings recently, with a notable reading of 165/55. This morning, her blood pressure was 149 before the visit. She attributes some of the higher readings to anxiety about the appointment and recalls a reading of 187 at a recent vascular doctor visit, which she attributes to being 'super excited.' At home, her blood pressure readings are usually in the 120s to 130s over 60s. Her current medications for hypertension include amlodipine 5 mg daily, metoprolol 100 mg daily, and a combination of triamterene 37.5 mg and hydrochlorothiazide 25 mg daily, of which she takes half a tablet. She is concerned about the size of the amlodipine tablets changing after a refill but confirms they are the same dosage. She also mentions adjusting her triamterene dose due to urinary incontinence. No headaches or blurry vision related to her blood pressure.  She has a history of ocular hypertension and has been using timolol and Combigan eye drops. 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 She is currently trying different eye drops to manage the condition. 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  She experiences some leg swelling and has been advised to use compression socks, although she is reluctant to wear them in the summer. The swelling is not severe but leaves an impression on her legs when wearing socks.      Past Medical History:  Diagnosis Date   Allergy    Anxiety    Cataract    Hypertension    Osteoporosis    Thyroid disease     Medications: Outpatient Medications Prior to Visit  Medication Sig   ALPRAZolam (XANAX) 0.25 MG tablet Take 0.25 mg by mouth at bedtime as needed for sleep.   ascorbic acid (VITAMIN C) 500 MG tablet Take 500 mg by mouth daily.   aspirin EC 81 MG tablet Take 81 mg by mouth daily.   brimonidine-timolol (COMBIGAN) 0.2-0.5 % ophthalmic solution Place 1 drop into both eyes every 12 (twelve) hours.   chlorhexidine (PERIDEX) 0.12 % solution Use as directed 15 mLs in the mouth or throat at bedtime.   Cholecalciferol (VITAMIN D-1000 MAX ST) 25 MCG (1000 UT) tablet Take 1,000 Units by mouth daily.   levothyroxine (SYNTHROID) 88 MCG tablet Take 88 mcg by mouth daily before breakfast.   loratadine (CLARITIN) 10 MG tablet Take 10 mg by mouth daily.   metoprolol succinate (TOPROL-XL) 100 MG 24 hr tablet Take 100 mg by mouth daily.   prednisoLONE acetate (PRED FORTE) 1 % ophthalmic suspension Place 1 drop into the right eye every 3 (  three) days.   triamterene-hydrochlorothiazide (MAXZIDE-25) 37.5-25 MG tablet Take 1 tablet by mouth daily. Taking 0.5 tablet.   [DISCONTINUED] amLODipine (NORVASC) 2.5 MG tablet Take 5 mg by mouth daily.   No facility-administered medications prior to visit.    Review of Systems  Last metabolic panel No results found for: "GLUCOSE", "NA", "K", "CL", "CO2", "BUN", "CREATININE", "EGFR", "CALCIUM", "PHOS", "PROT", "ALBUMIN", "LABGLOB", "AGRATIO", "BILITOT", "ALKPHOS", "AST", "ALT", "ANIONGAP" Last lipids No results found for: "CHOL", "HDL", "LDLCALC", "LDLDIRECT", "TRIG",  "CHOLHDL" Last hemoglobin A1c No results found for: "HGBA1C"      Objective    BP 130/64 (BP Location: Right Arm, Patient Position: Sitting, Cuff Size: Normal)   Pulse 60   Ht 5\' 1"  (1.549 m)   Wt 122 lb (55.3 kg)   SpO2 99%   BMI 23.05 kg/m  BP Readings from Last 3 Encounters:  04/14/23 130/64  04/07/23 (!) 187/70  03/11/23 (!) 162/62   Wt Readings from Last 3 Encounters:  04/14/23 122 lb (55.3 kg)  04/07/23 123 lb 6.4 oz (56 kg)  03/11/23 123 lb (55.8 kg)        Physical Exam   General: Alert, no acute distress Cardio: Normal S1 and S2, RRR, no r/m/g Pulm: CTAB, normal work of breathing   No results found for any visits on 04/14/23.  Assessment & Plan     Problem List Items Addressed This Visit       Cardiovascular and Mediastinum   Primary hypertension - Primary   Chronic hypertension with office BP 165/55, but home readings in the 130s/120s. Currently on amlodipine 5 mg, metoprolol 100 mg daily, and triamterene 37.5 mg/hydrochlorothiazide 25 mg daily (taking half a tablet). Emphasized maintaining BP under 150/90 to reduce stroke risk, balancing systolic pressure to avoid hypotension and hypertension. - Continue amlodipine 5 mg, metoprolol 100 mg daily, triamterene 37.5 mg/hydrochlorothiazide 25 mg daily. - Monitor BP at home and log readings.      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 History of partial cornea transplant with complications, including residual glue and increased intraocular pressure. Discussed potential for future laser treatment if drops are insufficient. - Continue current ophthalmic medications as prescribed. - Follow up with ophthalmologist for ongoing management.  Fuchs' Dystrophy Post-partial cornea transplant in 2020 with ongoing blurry vision and dry eyes managed with eye drops. Considering a second opinion from another  cornea specialist due to dissatisfaction with previous surgical outcome. Discussed potential benefits and risks of alternative treatments. - Continue current eye drops as prescribed. - Consider referral to another cornea specialist for a second opinion.  General Health Maintenance Received high-dose flu shot in October and multiple COVID-19 vaccinations. No additional flu shot needed for this season. - No additional flu shot needed for this season.  Follow-up - Schedule follow-up visit in four months for physical exam. - Include lab panels (liver, kidney, electrolytes, anemia, diabetes, cholesterol) and an EKG in the follow-up visit.      Return in about 4 months (around 08/14/2023) for AWV, CPE.      Ronnald Ramp, MD  [facility] [phone] (phone) [phone] (fax)  [facility]

## 2023-04-14 NOTE — Assessment & Plan Note (Signed)
 Chronic hypertension with office BP 165/55, but home readings in the 130s/120s. Currently on amlodipine 5 mg, metoprolol 100 mg daily, and triamterene 37.5 mg/hydrochlorothiazide 25 mg daily (taking half a tablet). Emphasized maintaining BP under 150/90 to reduce stroke risk, balancing systolic pressure to avoid hypotension and hypertension. - Continue amlodipine 5 mg, metoprolol 100 mg daily, triamterene 37.5 mg/hydrochlorothiazide 25 mg daily. - Monitor BP at home and log readings.

## 2023-04-16 DIAGNOSIS — H18513 Endothelial corneal dystrophy, bilateral: Secondary | ICD-10-CM | POA: Diagnosis not present

## 2023-04-16 DIAGNOSIS — H1013 Acute atopic conjunctivitis, bilateral: Secondary | ICD-10-CM | POA: Diagnosis not present

## 2023-04-22 ENCOUNTER — Other Ambulatory Visit: Payer: Self-pay | Admitting: Family Medicine

## 2023-04-22 MED ORDER — METOPROLOL SUCCINATE ER 100 MG PO TB24: 100.0000 mg | ORAL_TABLET | Freq: Every day | ORAL | 1 refills | Status: DC

## 2023-04-22 NOTE — Telephone Encounter (Signed)
 Copied from CRM [id number]. Topic: Clinical - Medication Refill >> Apr 22, 2023 11:04 AM [name] wrote: Most Recent Primary Care Visit:  Provider: Ronnald Ramp  Department: [facility]  Visit Type: OFFICE VISIT  Date: 04/14/2023  Medication: metoprolol succinate (TOPROL-XL) 100 MG 24 hr tablet  Has the patient contacted their pharmacy? Yes (Agent: If no, request that the patient contact the pharmacy for the refill. If patient does not wish to contact the pharmacy document the reason why and proceed with request.) (Agent: If yes, when and what did the pharmacy advise?)  Is this the correct pharmacy for this prescription? Yes If no, delete pharmacy and type the correct one.  This is the patient's preferred pharmacy:  [facility] [address] [place] - [address] [place] [address] Phone: [phone] Fax: [phone]   Has the prescription been filled recently? Yes  Is the patient out of the medication? No  Has the patient been seen for an appointment in the last year OR does the patient have an upcoming appointment? No  Can we respond through MyChart? No  Agent: Please be advised that Rx refills may take up to 3 business days. We ask that you follow-up with your pharmacy.

## 2023-04-22 NOTE — Telephone Encounter (Signed)
Medication has been filled today

## 2023-04-29 ENCOUNTER — Telehealth: Payer: Self-pay | Admitting: Family Medicine

## 2023-04-29 NOTE — Telephone Encounter (Signed)
 Dr Clent Ridges is leaving the practice and your New Patient or Transfer of Care appointment needs to be rescheduled with another provider. Please call the office to schedule a Transfer of Care to either Dr Charlann Lange, Darleen Crocker or Kara Dies, NP. [id number] please schedule.  Thank you

## 2023-05-05 DIAGNOSIS — B880 Other acariasis: Secondary | ICD-10-CM | POA: Diagnosis not present

## 2023-05-05 DIAGNOSIS — H10503 Unspecified blepharoconjunctivitis, bilateral: Secondary | ICD-10-CM | POA: Diagnosis not present

## 2023-05-19 ENCOUNTER — Encounter (INDEPENDENT_AMBULATORY_CARE_PROVIDER_SITE_OTHER): Payer: HMO

## 2023-05-19 ENCOUNTER — Ambulatory Visit (INDEPENDENT_AMBULATORY_CARE_PROVIDER_SITE_OTHER): Payer: HMO | Admitting: Vascular Surgery

## 2023-05-21 ENCOUNTER — Ambulatory Visit: Payer: Medicare HMO | Admitting: Family Medicine

## 2023-05-21 DIAGNOSIS — H10503 Unspecified blepharoconjunctivitis, bilateral: Secondary | ICD-10-CM | POA: Diagnosis not present

## 2023-05-21 DIAGNOSIS — B880 Other acariasis: Secondary | ICD-10-CM | POA: Diagnosis not present

## 2023-05-26 ENCOUNTER — Telehealth: Payer: Self-pay | Admitting: Family Medicine

## 2023-05-26 ENCOUNTER — Other Ambulatory Visit: Payer: Self-pay

## 2023-05-26 MED ORDER — AMLODIPINE BESYLATE 5 MG PO TABS: 5.0000 mg | ORAL_TABLET | Freq: Every day | ORAL | 0 refills | Status: DC

## 2023-05-26 NOTE — Telephone Encounter (Signed)
 [facility] is requesting refill amLODipine (NORVASC) 5 MG tablet [  Please advise

## 2023-05-26 NOTE — Addendum Note (Signed)
 Addended by: Naira Standiford D on: 05/26/2023 01:55 PM   Modules accepted: Orders

## 2023-06-15 ENCOUNTER — Ambulatory Visit (INDEPENDENT_AMBULATORY_CARE_PROVIDER_SITE_OTHER): Admitting: Family Medicine

## 2023-06-15 ENCOUNTER — Encounter: Payer: Self-pay | Admitting: Family Medicine

## 2023-06-15 VITALS — BP 138/76 | HR 67 | Ht 61.0 in | Wt 122.0 lb

## 2023-06-15 DIAGNOSIS — L989 Disorder of the skin and subcutaneous tissue, unspecified: Secondary | ICD-10-CM

## 2023-06-15 DIAGNOSIS — D485 Neoplasm of uncertain behavior of skin: Secondary | ICD-10-CM

## 2023-06-15 NOTE — Progress Notes (Signed)
 ACUTE PATIENT VISIT    Patient: Kim Cardenas   DOB: [birth year]   78 y.o. Female  MRN: [id number] Visit Date: 06/15/2023  Today's healthcare provider: Mimi Alt, MD   PCP: Mimi Alt, MD   Chief Complaint  Patient presents with   Insect Bite    Place on the inner side of her L knee, its been there for 3 weeks     Subjective     HPI     Insect Bite    Additional comments: Place on the inner side of her L knee, its been there for 3 weeks       Last edited by Bart Lieu, CMA on 06/15/2023  1:14 PM.       Discussed the use of AI scribe software for clinical note transcription with the patient, who gave verbal consent to proceed.  History of Present Illness          Discussed the use of AI scribe software for clinical note transcription with the patient, who gave verbal consent to proceed.  History of Present Illness Kim Cardenas is a 78 year old female with a history of squamous cell carcinoma who presents with a persistent lesion on her left leg.  She has a red spot on her left leg for at least three weeks, which has become crusty and raised with a hard plate-like texture. Neosporin application softens the crust, but there is no change in the lesion's appearance. The lesion is asymptomatic, with no pain or itching.  She attempted to have the lesion evaluated by her dermatologist last week, but she was unable to see her due to a busy schedule. She has an appointment scheduled for September but is hoping for an earlier evaluation if a cancellation occurs.  She has a history of squamous cell carcinoma on her leg several years ago, which raises her concern about the current lesion being a recurrence. The lesion has a rough crust but does not ooze.  Her blood pressure was recorded at home as 138/76. No symptoms of lightheadedness or dizziness. She is currently monitoring her blood pressure at home.       Past Medical  History:  Diagnosis Date   Allergy    Anxiety    Cataract    Hypertension    Osteoporosis    Thyroid disease     Medications: Outpatient Medications Prior to Visit  Medication Sig   XDEMVY 0.25 % SOLN    ALPRAZolam (XANAX) 0.25 MG tablet Take 0.25 mg by mouth at bedtime as needed for sleep.   amLODipine  (NORVASC ) 5 MG tablet Take 1 tablet (5 mg total) by mouth daily.   ascorbic acid (VITAMIN C) 500 MG tablet Take 500 mg by mouth daily.   aspirin EC 81 MG tablet Take 81 mg by mouth daily.   brimonidine-timolol (COMBIGAN) 0.2-0.5 % ophthalmic solution Place 1 drop into both eyes every 12 (twelve) hours.   chlorhexidine (PERIDEX) 0.12 % solution Use as directed 15 mLs in the mouth or throat at bedtime.   Cholecalciferol (VITAMIN D-1000 MAX ST) 25 MCG (1000 UT) tablet Take 1,000 Units by mouth daily.   levothyroxine (SYNTHROID) 88 MCG tablet Take 88 mcg by mouth daily before breakfast.   loratadine (CLARITIN) 10 MG tablet Take 10 mg by mouth daily.   metoprolol  succinate (TOPROL -XL) 100 MG 24 hr tablet Take 1 tablet (100 mg total) by mouth daily.   prednisoLONE acetate (PRED FORTE) 1 % ophthalmic suspension  Place 1 drop into the right eye every 3 (three) days.   triamterene-hydrochlorothiazide (MAXZIDE-25) 37.5-25 MG tablet Take 1 tablet by mouth daily. Taking 0.5 tablet.   No facility-administered medications prior to visit.    Review of Systems  Last metabolic panel No results found for: "GLUCOSE", "NA", "K", "CL", "CO2", "BUN", "CREATININE", "EGFR", "CALCIUM", "PHOS", "PROT", "ALBUMIN", "LABGLOB", "AGRATIO", "BILITOT", "ALKPHOS", "AST", "ALT", "ANIONGAP" Last thyroid functions No results found for: "TSH", "T3TOTAL", "T4TOTAL", "THYROIDAB"      Objective    BP 138/76 (Cuff Size: Normal) Comment: patient reported home BP today  Pulse 67   Ht 5\' 1"  (1.549 m)   Wt 122 lb (55.3 kg)   SpO2 98%   BMI 23.05 kg/m  BP Readings from Last 3 Encounters:  06/15/23 138/76  04/14/23  130/64  04/07/23 (!) 187/70   Wt Readings from Last 3 Encounters:  06/15/23 122 lb (55.3 kg)  04/14/23 122 lb (55.3 kg)  04/07/23 123 lb 6.4 oz (56 kg)        Physical Exam Skin:    Findings: Lesion present.            No results found for any visits on 06/15/23.  Assessment & Plan     Problem List Items Addressed This Visit   None    Assessment & Plan Lesion on left leg Lesion on the left medial leg, present for three weeks, approximately 1 cm in diameter, rough plaque, circular, well-circumscribed with dark pigmentation. Differential diagnosis includes wart or possible squamous cell carcinoma recurrence given history. No pain or itching reported. - Performed cryotherapy on the lesion with informed consent, including discussion of potential redness and irritation during healing, pt elected to proceed with procedure   - Refer to dermatology for evaluation and possible biopsy due to history of squamous cell carcinoma - Advise her to monitor for changes in the lesion - Instruct her to use soap and water for cleaning the area - Advise her to contact dermatologist for an earlier appointment if possible  Hypertension Blood pressure readings are variable, with a home reading of 138/76 mmHg and an office reading of 158/60 mmHg. No symptoms of lightheadedness or dizziness reported. Current management appears adequate as home readings are within acceptable range. - Monitor blood pressure at home - Continue current management if home readings remain around 130s       Return in about 2 months (around 08/15/2023) for AWV.         Mimi Alt, MD  [facility] [phone] (phone) [phone] (fax)  [facility]

## 2023-06-25 DIAGNOSIS — H10503 Unspecified blepharoconjunctivitis, bilateral: Secondary | ICD-10-CM | POA: Diagnosis not present

## 2023-06-25 DIAGNOSIS — B880 Other acariasis: Secondary | ICD-10-CM | POA: Diagnosis not present

## 2023-06-30 ENCOUNTER — Encounter (INDEPENDENT_AMBULATORY_CARE_PROVIDER_SITE_OTHER): Payer: Self-pay

## 2023-06-30 DIAGNOSIS — L538 Other specified erythematous conditions: Secondary | ICD-10-CM | POA: Diagnosis not present

## 2023-06-30 DIAGNOSIS — L820 Inflamed seborrheic keratosis: Secondary | ICD-10-CM | POA: Diagnosis not present

## 2023-06-30 DIAGNOSIS — L2989 Other pruritus: Secondary | ICD-10-CM | POA: Diagnosis not present

## 2023-06-30 DIAGNOSIS — D485 Neoplasm of uncertain behavior of skin: Secondary | ICD-10-CM | POA: Diagnosis not present

## 2023-06-30 DIAGNOSIS — C44729 Squamous cell carcinoma of skin of left lower limb, including hip: Secondary | ICD-10-CM | POA: Diagnosis not present

## 2023-07-22 DIAGNOSIS — C44729 Squamous cell carcinoma of skin of left lower limb, including hip: Secondary | ICD-10-CM | POA: Diagnosis not present

## 2023-08-11 ENCOUNTER — Ambulatory Visit (INDEPENDENT_AMBULATORY_CARE_PROVIDER_SITE_OTHER)

## 2023-08-11 ENCOUNTER — Ambulatory Visit: Admitting: Podiatry

## 2023-08-11 VITALS — Ht 61.0 in | Wt 122.0 lb

## 2023-08-11 DIAGNOSIS — R600 Localized edema: Secondary | ICD-10-CM | POA: Diagnosis not present

## 2023-08-11 DIAGNOSIS — M7752 Other enthesopathy of left foot: Secondary | ICD-10-CM | POA: Diagnosis not present

## 2023-08-11 NOTE — Progress Notes (Signed)
   Chief Complaint  Patient presents with   Toe Pain    Pt is here due to left foot toe pain, pt thinks she may have gout not sure but is concern with the pain that has been going on for several weeks, also would like her toenails trimmed.    HPI: 77 y.o. female presenting today as a new patient for evaluation of pain and tenderness associated to the digits of the left foot.  Recent history of excision of malignant skin lesion along the medial aspect of the left leg.  DOS: 07/22/2023.  She states that postoperatively she had an excessive amount of bleeding in this area and lidocaine with epinephrine was infiltrated around the region to control the bleeding.  The lesion is very closely associated to the saphenous vein around the medial aspect of the knee.  After this was performed she began to have swelling with pain and redness to the toes of the left foot.  Past Medical History:  Diagnosis Date   Allergy    Anxiety    Cataract    Hypertension    Osteoporosis    Thyroid disease     Past Surgical History:  Procedure Laterality Date   CORNEAL TRANSPLANT Right 2020   EYE SURGERY Bilateral     Allergies  Allergen Reactions   Allopurinol     Headache    Erythromycin Other (See Comments)    Other Reaction(s): GI Intolerance     Physical Exam: General: The patient is alert and oriented x3 in no acute distress.  Dermatology: Skin is warm, dry and supple bilateral lower extremities.   Vascular: Palpable pedal pulses bilaterally. Capillary refill within normal limits.  Moderate edema noted to the left forefoot and toes with mild erythema and what appears to be venous congestion.  Varicosities also noted bilateral lower extremities  Neurological: Grossly intact via light touch  Musculoskeletal Exam: No pedal deformities noted  Radiographic Exam LT foot 08/11/2023:  Normal osseous mineralization. Joint spaces preserved.  No fractures or osseous irregularities noted.  Assessment/Plan  of Care: 1.  Possible concern for venous insufficiency/venous compromise of the great saphenous vein 2.  Patient of malignant skin lesion great saphenous  -Patient evaluated.  X-rays reviewed -Order placed for venous Doppler to rule out any venous pathology -Will plan to contact the patient after results are available -Return to clinic PRN       Thresa EMERSON Sar, DPM [facility]  Dr. Thresa EMERSON Sar, DPM    [address] [place], [place] [address]                Office [phone]  Fax [phone]

## 2023-08-17 ENCOUNTER — Other Ambulatory Visit: Payer: Self-pay | Admitting: Family Medicine

## 2023-08-19 ENCOUNTER — Ambulatory Visit (INDEPENDENT_AMBULATORY_CARE_PROVIDER_SITE_OTHER)

## 2023-08-19 DIAGNOSIS — R600 Localized edema: Secondary | ICD-10-CM

## 2023-09-03 DIAGNOSIS — Z961 Presence of intraocular lens: Secondary | ICD-10-CM | POA: Diagnosis not present

## 2023-09-03 DIAGNOSIS — H40002 Preglaucoma, unspecified, left eye: Secondary | ICD-10-CM | POA: Diagnosis not present

## 2023-09-03 DIAGNOSIS — H401111 Primary open-angle glaucoma, right eye, mild stage: Secondary | ICD-10-CM | POA: Diagnosis not present

## 2023-09-14 ENCOUNTER — Ambulatory Visit: Payer: Self-pay

## 2023-09-14 NOTE — Telephone Encounter (Signed)
 FYI Only or Action Required?: FYI only for provider.  Patient was last seen in primary care on 06/15/2023 by Sharma Coyer, MD.  Called Nurse Triage reporting Pain.  Symptoms began x 3 days.  Interventions attempted: Nothing.  Symptoms are: unchanged.  Triage Disposition: See PCP When Office is Open (Within 3 Days)  Patient/caregiver understands and will follow disposition?: Yes    Copied from CRM #[id number]. Topic: Clinical - Red Word Triage >> Sep 14, 2023  9:16 AM Carlatta H wrote: [facility] that prompted transfer to Nurse Triage: Patients left foot and leg has been swollen, hot, painful and red since Saturday// Reason for Disposition  [1] MODERATE pain (e.g., interferes with normal activities, limping) AND [2] present > 3 days  Answer Assessment - Initial Assessment Questions 1. ONSET: When did the pain start?      Saturday 2. LOCATION: Where is the pain located?      Swollen red pain left foot to ankle, warm to touch 3. PAIN: How bad is the pain?    (Scale 1-10; or mild, moderate, severe)     0 to 8/10 4. WORK OR EXERCISE: Has there been any recent work or exercise that involved this part of the body?      no 5. CAUSE: What do you think is causing the leg pain?     unknown 6. OTHER SYMPTOMS: Do you have any other symptoms? (e.g., chest pain, back pain, breathing difficulty, swelling, rash, fever, numbness, weakness)     no 7. PREGNANCY: Is there any chance you are pregnant? When was your last menstrual period?     no  Protocols used: Leg Pain-A-AH

## 2023-09-15 ENCOUNTER — Ambulatory Visit: Admitting: Physician Assistant

## 2023-09-15 ENCOUNTER — Ambulatory Visit: Admitting: Podiatry

## 2023-09-15 ENCOUNTER — Encounter: Payer: Self-pay | Admitting: Physician Assistant

## 2023-09-15 VITALS — BP 150/60 | HR 66 | Resp 14 | Ht 61.0 in | Wt 122.9 lb

## 2023-09-15 DIAGNOSIS — I872 Venous insufficiency (chronic) (peripheral): Secondary | ICD-10-CM

## 2023-09-15 DIAGNOSIS — C4492 Squamous cell carcinoma of skin, unspecified: Secondary | ICD-10-CM

## 2023-09-15 DIAGNOSIS — M25472 Effusion, left ankle: Secondary | ICD-10-CM

## 2023-09-15 DIAGNOSIS — I10 Essential (primary) hypertension: Secondary | ICD-10-CM

## 2023-09-15 NOTE — Progress Notes (Signed)
 Established patient visit  Patient: Kim Cardenas   DOB: 1945/09/01   78 y.o. Female  MRN: [id number] Visit Date: 09/15/2023  Today's healthcare provider: Jolynn Spencer, PA-C   Chief Complaint  Patient presents with   Leg Swelling    L foot swelling, pain 8/10 on Saturday, no pain now, hot to the touch, redness x 3 days no otc medication. Friday night did some elevation of feet, hurt when bending foot/ankle. 07/22/23 had a skin cancer cell removal on L leg.   Subjective     HPI     Leg Swelling    Additional comments: L foot swelling, pain 8/10 on Saturday, no pain now, hot to the touch, redness x 3 days no otc medication. Friday night did some elevation of feet, hurt when bending foot/ankle. 07/22/23 had a skin cancer cell removal on L leg.      Last edited by Wilfred Hargis RAMAN, CMA on 09/15/2023 11:09 AM.       Discussed the use of AI scribe software for clinical note transcription with the patient, who gave verbal consent to proceed.  History of Present Illness Kim Cardenas is a 78 year old female with hypertension who presents with ankle swelling and pain.  She experiences ankle swelling and pain that began after elevating her legs. The pain occurs with foot movement and subsides by morning, returning later in the day, especially with prolonged standing. Ice application has been used, but no medication has been taken for this episode.  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 Post-procedure, she experienced toe pain and swelling, initially suspected to be gout, but a Doppler study ruled out a blood clot. Advil and Aleve have been effective for past toe pain and swelling.  Her medications include amlodipine  5 mg, metoprolol  100 mg, and triamterene/hydrochlorothiazide, with a reduced dose due to frequent urination. Her blood pressure is typically high in clinical settings  but normalizes at home, with a reading of 141/67 this morning after caffeine intake.  She has a past medical history of gout, though uric acid levels were normal. Allopurinol was discontinued due to severe headaches. She denies chest pain, shortness of breath, rapid heart beating, dizziness, blurry vision, or double vision.       04/14/2023   11:34 AM 03/11/2023    1:08 PM  Depression screen PHQ 2/9  Decreased Interest 1 0  Down, Depressed, Hopeless 0 0  PHQ - 2 Score 1 0  Altered sleeping 3 3  Tired, decreased energy 1 0  Change in appetite 0 0  Feeling bad or failure about yourself  0 0  Trouble concentrating 0 0  Moving slowly or fidgety/restless 1 0  Suicidal thoughts 0 0  PHQ-9 Score 6 3  Difficult doing work/chores Not difficult at all Not difficult at all      04/14/2023   11:35 AM 03/11/2023    1:09 PM  GAD 7 : Generalized Anxiety Score  Nervous, Anxious, on Edge 1 1  Control/stop worrying 1 0  Worry too much - different things 1 1  Trouble relaxing 1 0  Restless 1 0  Easily annoyed or irritable 1 1  Afraid - awful might happen 0 0  Total GAD 7 Score 6 3  Anxiety Difficulty Not difficult at all Not difficult at all    Medications: Outpatient Medications Prior to Visit  Medication Sig   ALPRAZolam (XANAX) 0.25  MG tablet Take 0.25 mg by mouth at bedtime as needed for sleep.   amLODipine  (NORVASC ) 5 MG tablet Take 1 tablet by mouth once daily   ascorbic acid (VITAMIN C) 500 MG tablet Take 500 mg by mouth daily.   aspirin EC 81 MG tablet Take 81 mg by mouth daily.   chlorhexidine (PERIDEX) 0.12 % solution Use as directed 15 mLs in the mouth or throat at bedtime.   Cholecalciferol (VITAMIN D-1000 MAX ST) 25 MCG (1000 UT) tablet Take 1,000 Units by mouth daily.   levothyroxine (SYNTHROID) 88 MCG tablet Take 88 mcg by mouth daily before breakfast.   loratadine (CLARITIN) 10 MG tablet Take 10 mg by mouth daily.   metoprolol  succinate (TOPROL -XL) 100 MG 24 hr tablet Take 1  tablet (100 mg total) by mouth daily.   prednisoLONE acetate (PRED FORTE) 1 % ophthalmic suspension Place 1 drop into the right eye every 3 (three) days.   triamterene-hydrochlorothiazide (MAXZIDE-25) 37.5-25 MG tablet Take 1 tablet by mouth daily. Taking 0.5 tablet.   brimonidine-timolol (COMBIGAN) 0.2-0.5 % ophthalmic solution Place 1 drop into both eyes every 12 (twelve) hours.   XDEMVY 0.25 % SOLN    No facility-administered medications prior to visit.    Review of Systems All negative Except see HPI       Objective    BP (!) 178/70 (BP Location: Left Arm, Patient Position: Sitting, Cuff Size: Normal) Comment (Cuff Size): manual  Pulse 66   Resp 14   Ht 5' 1 (1.549 m)   Wt 122 lb 14.4 oz (55.7 kg)   SpO2 98%   BMI 23.22 kg/m     Physical Exam Vitals reviewed.  Constitutional:      General: She is not in acute distress.    Appearance: Normal appearance. She is well-developed. She is not diaphoretic.  HENT:     Head: Normocephalic and atraumatic.  Eyes:     General: No scleral icterus.    Conjunctiva/sclera: Conjunctivae normal.  Neck:     Thyroid: No thyromegaly.  Cardiovascular:     Rate and Rhythm: Normal rate and regular rhythm.     Pulses: Normal pulses.     Heart sounds: Normal heart sounds. No murmur heard. Pulmonary:     Effort: Pulmonary effort is normal. No respiratory distress.     Breath sounds: Normal breath sounds. No wheezing, rhonchi or rales.  Musculoskeletal:        General: Swelling and tenderness (left ankle, left foot) present.     Cervical back: Neck supple.     Right lower leg: No edema.     Left lower leg: No edema.  Lymphadenopathy:     Cervical: No cervical adenopathy.  Skin:    General: Skin is warm and dry.     Findings: No rash.  Neurological:     Mental Status: She is alert and oriented to person, place, and time. Mental status is at baseline.  Psychiatric:        Mood and Affect: Mood normal.        Behavior: Behavior  normal.      No results found for any visits on 09/15/23.      Assessment & Plan Ankle and foot pain and swelling under evaluation Swelling and pain in the right ankle and foot, particularly around the ankle and two toes. Severe pain subsided. History of venous insufficiency and recent squamous cell carcinoma excision. Doppler ultrasound ruled out DVT and reflux. Differential includes venous insufficiency and  possible gout. - Order blood work including inflammatory markers and D-dimer. - Advise to return for blood work at 1 PM or any time before 4:30 PM today. - Advise to monitor for symptoms such as chest pain, rapid heart rate, or shortness of breath and seek urgent care if these occur.  Chronic venous insufficiency with lower extremity swelling Chronic venous insufficiency with swelling and redness, exacerbated by prolonged standing, improved with leg elevation. - Advise to elevate legs to reduce swelling and redness. Will follow-up with primary  Hypertension Chronic Hypertension with office reading of 170/70, possibly influenced by white coat syndrome. On amlodipine , metoprolol , and triamterene/hydrochlorothiazide. Reports taking half a tablet of triamterene/hydrochlorothiazide due to frequent urination. Recommended full tablet in the morning to manage blood pressure and avoid nighttime urination. - Advise to take a full tablet of triamterene/hydrochlorothiazide in the morning. - Instruct to measure blood pressure at home every morning and evening and record the readings. - Advise to bring home blood pressure records and cuff to the next appointment for comparison.  History of squamous cell carcinoma of the skin, status post excision Squamous cell carcinoma excised on July 22, 2023. Managed post-surgical bleeding with epinephrine and lidocaine. No current pain or complications from the excision site.   No orders of the defined types were placed in this encounter.   No follow-ups  on file.   The patient was advised to call back or seek an in-person evaluation if the symptoms worsen or if the condition fails to improve as anticipated.  I discussed the assessment and treatment plan with the patient. The patient was provided an opportunity to ask questions and all were answered. The patient agreed with the plan and demonstrated an understanding of the instructions.  I, Colin Norment, PA-C have reviewed all documentation for this visit. The documentation on 09/15/2023  for the exam, diagnosis, procedures, and orders are all accurate and complete.  Jolynn Spencer, [facility], MMS [facility] [phone] (phone) [phone] (fax)  [facility]

## 2023-09-16 LAB — CBC WITH DIFFERENTIAL/PLATELET
Basophils Absolute: 0.2 x10E3/uL (ref 0.0–0.2)
Basos: 1 %
EOS (ABSOLUTE): 1 x10E3/uL — ABNORMAL HIGH (ref 0.0–0.4)
Eos: 7 %
Hematocrit: 47.1 % — ABNORMAL HIGH (ref 34.0–46.6)
Hemoglobin: 15.3 g/dL (ref 11.1–15.9)
Immature Grans (Abs): 0.1 x10E3/uL (ref 0.0–0.1)
Immature Granulocytes: 1 %
Lymphocytes Absolute: 1.6 x10E3/uL (ref 0.7–3.1)
Lymphs: 11 %
MCH: 30.5 pg (ref 26.6–33.0)
MCHC: 32.5 g/dL (ref 31.5–35.7)
MCV: 94 fL (ref 79–97)
Monocytes Absolute: 0.6 x10E3/uL (ref 0.1–0.9)
Monocytes: 4 %
Neutrophils Absolute: 10.9 x10E3/uL — ABNORMAL HIGH (ref 1.4–7.0)
Neutrophils: 76 %
Platelets: 666 x10E3/uL — ABNORMAL HIGH (ref 150–450)
RBC: 5.02 x10E6/uL (ref 3.77–5.28)
RDW: 17.6 % — ABNORMAL HIGH (ref 11.7–15.4)
WBC: 14.4 x10E3/uL — ABNORMAL HIGH (ref 3.4–10.8)

## 2023-09-16 LAB — C-REACTIVE PROTEIN: CRP: 7 mg/L (ref 0–10)

## 2023-09-16 LAB — BASIC METABOLIC PANEL WITH GFR
BUN/Creatinine Ratio: 23 (ref 12–28)
BUN: 18 mg/dL (ref 8–27)
CO2: 26 mmol/L (ref 20–29)
Calcium: 9.7 mg/dL (ref 8.7–10.3)
Chloride: 95 mmol/L — ABNORMAL LOW (ref 96–106)
Creatinine, Ser: 0.77 mg/dL (ref 0.57–1.00)
Glucose: 133 mg/dL — ABNORMAL HIGH (ref 70–99)
Potassium: 5 mmol/L (ref 3.5–5.2)
Sodium: 134 mmol/L (ref 134–144)
eGFR: 80 mL/min/1.73 (ref >59)

## 2023-09-16 LAB — SEDIMENTATION RATE: Sed Rate: 7 mm/h (ref 0–40)

## 2023-09-16 LAB — D-DIMER, QUANTITATIVE: D-DIMER: 0.3 mg{FEU}/L (ref 0.00–0.49)

## 2023-09-16 LAB — URIC ACID: Uric Acid: 7.1 mg/dL (ref 3.1–7.9)

## 2023-09-17 ENCOUNTER — Ambulatory Visit: Payer: Self-pay | Admitting: Physician Assistant

## 2023-09-17 NOTE — Progress Notes (Signed)
 Please check with pt if she was able to read her lab results and my message and check if she wants to start abx vs prednisone vs nsaids

## 2023-09-18 ENCOUNTER — Other Ambulatory Visit: Payer: Self-pay | Admitting: Physician Assistant

## 2023-09-18 DIAGNOSIS — L03119 Cellulitis of unspecified part of limb: Secondary | ICD-10-CM

## 2023-09-18 MED ORDER — CEPHALEXIN 500 MG PO CAPS: 500.0000 mg | ORAL_CAPSULE | Freq: Two times a day (BID) | ORAL | 0 refills | Status: DC

## 2023-09-18 NOTE — Telephone Encounter (Signed)
 Copied from CRM [id number]. Topic: Clinical - Medication Question >> Sep 18, 2023  3:44 PM Kim Cardenas wrote: Pt would like to know why she hasn't recived a order for Antibiotics as the Dr would like for her to. Please see encounter

## 2023-09-29 ENCOUNTER — Ambulatory Visit: Admitting: Physician Assistant

## 2023-09-29 ENCOUNTER — Encounter: Payer: Self-pay | Admitting: Physician Assistant

## 2023-09-29 VITALS — BP 171/83 | HR 62 | Ht 61.5 in | Wt 120.6 lb

## 2023-09-29 DIAGNOSIS — D72828 Other elevated white blood cell count: Secondary | ICD-10-CM | POA: Diagnosis not present

## 2023-09-29 DIAGNOSIS — I872 Venous insufficiency (chronic) (peripheral): Secondary | ICD-10-CM | POA: Diagnosis not present

## 2023-09-29 DIAGNOSIS — R413 Other amnesia: Secondary | ICD-10-CM

## 2023-09-29 DIAGNOSIS — E790 Hyperuricemia without signs of inflammatory arthritis and tophaceous disease: Secondary | ICD-10-CM

## 2023-09-29 DIAGNOSIS — H18511 Endothelial corneal dystrophy, right eye: Secondary | ICD-10-CM | POA: Diagnosis not present

## 2023-09-29 DIAGNOSIS — I10 Essential (primary) hypertension: Secondary | ICD-10-CM

## 2023-09-29 DIAGNOSIS — D849 Immunodeficiency, unspecified: Secondary | ICD-10-CM

## 2023-09-29 DIAGNOSIS — I83892 Varicose veins of left lower extremities with other complications: Secondary | ICD-10-CM | POA: Diagnosis not present

## 2023-09-29 NOTE — Progress Notes (Signed)
 Established patient visit  Patient: Kim Cardenas   DOB: 1945-04-29   78 y.o. Female  MRN: [id number] Visit Date: 09/29/2023  Today's healthcare provider: Chino Sardo, PA-C   Chief Complaint  Patient presents with   Medical Management of Chronic Issues    Patient is present for hypertension follow-up and reports she also has additional questions about her recent lab work.    Hypertension    She was last seen for hypertension 2 weeks ago.  BP at that visit was 150/60. Management since that visit includes to take a full tablet of triamterene/hydrochlorothiazide in the morning and continue other medication regimen. She reports excellent compliance with treatment. She is not having side effects. She is following a Regular diet. She is not exercising. She does not smoke. Outside blood pressures are 130's/60-70. Symptoms: lower extremity edema   Subjective     HPI     Medical Management of Chronic Issues    Additional comments: Patient is present for hypertension follow-up and reports she also has additional questions about her recent lab work.         Hypertension    Additional comments: She was last seen for hypertension 2 weeks ago.  BP at that visit was 150/60. Management since that visit includes to take a full tablet of triamterene/hydrochlorothiazide in the morning and continue other medication regimen. She reports excellent compliance with treatment. She is not having side effects. She is following a Regular diet. She is not exercising. She does not smoke. Outside blood pressures are 130's/60-70. Symptoms: lower extremity edema      Last edited by Lilian Fitzpatrick, CMA on 09/29/2023  1:11 PM.       Discussed the use of AI scribe software for clinical note transcription with the patient, who gave verbal consent to proceed.  History of Present Illness Kim Cardenas is a 78 year old female with hypertension who presents with concerns about blood pressure management and  medication side effects.  She experiences fluctuating blood pressure readings, with home measurements of 135/70 mmHg before bed and 138/71 mmHg at 10:30 AM. She has 'white coat syndrome' causing elevated readings in medical settings and brought her blood pressure monitor for comparison. Her current medications include amlodipine , metoprolol , triamcinolone, and hydrochlorothiazide. She takes a half tablet of hydrochlorothiazide due to frequent urination occurring about an hour and a half after ingestion. A full tablet increases urination frequency.  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 She takes prednisone every other day for her eye condition. No double vision is present.  She experiences symptoms suggestive of gout, such as redness and swelling in the foot, with a uric acid level of 7.1 mg/dL. She previously took colchicine and allopurinol but discontinued due to side effects. She feels like she is stepping on something when wearing shoes, but there is no pain or tingling. 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  Her glucose levels have been elevated, with a recent reading of 133 mg/dL, although usually under 100 mg/dL.  She experiences anxiety in medical settings, attributed to 'white coat syndrome'. She has not been exercising recently due to throat issues and the closure of her gym. There is a family history of anxiety.       09/29/2023    1:11 PM 04/14/2023   11:34 AM 03/11/2023    1:08 PM  Depression screen PHQ 2/9  Decreased Interest 1 1 0  Down,  Depressed, Hopeless 0 0 0  PHQ - 2 Score 1 1 0  Altered sleeping 2 3 3   Tired, decreased energy 1 1 0  Change in appetite 0 0 0  Feeling bad or failure about yourself  0 0 0  Trouble concentrating 1 0 0  Moving slowly or fidgety/restless 0 1 0  Suicidal thoughts 0 0 0  PHQ-9 Score 5 6 3   Difficult doing work/chores Somewhat difficult Not difficult at all Not  difficult at all      09/29/2023    1:11 PM 04/14/2023   11:35 AM 03/11/2023    1:09 PM  GAD 7 : Generalized Anxiety Score  Nervous, Anxious, on Edge 1 1 1   Control/stop worrying 1 1 0  Worry too much - different things 1 1 1   Trouble relaxing 1 1 0  Restless 1 1 0  Easily annoyed or irritable 1 1 1   Afraid - awful might happen 1 0 0  Total GAD 7 Score 7 6 3   Anxiety Difficulty Somewhat difficult Not difficult at all Not difficult at all    Medications: Outpatient Medications Prior to Visit  Medication Sig   ALPRAZolam (XANAX) 0.25 MG tablet Take 0.25 mg by mouth at bedtime as needed for sleep.   amLODipine  (NORVASC ) 5 MG tablet Take 1 tablet by mouth once daily   ascorbic acid (VITAMIN C) 500 MG tablet Take 500 mg by mouth daily.   aspirin EC 81 MG tablet Take 81 mg by mouth daily.   cephALEXin  (KEFLEX ) 500 MG capsule Take 1 capsule (500 mg total) by mouth 2 (two) times daily. If there is no improvement within 5 days, the treatment duration may be extended up to 10 days.   chlorhexidine (PERIDEX) 0.12 % solution Use as directed 15 mLs in the mouth or throat at bedtime.   Cholecalciferol (VITAMIN D-1000 MAX ST) 25 MCG (1000 UT) tablet Take 1,000 Units by mouth daily.   levothyroxine (SYNTHROID) 88 MCG tablet Take 88 mcg by mouth daily before breakfast.   loratadine (CLARITIN) 10 MG tablet Take 10 mg by mouth daily.   metoprolol  succinate (TOPROL -XL) 100 MG 24 hr tablet Take 1 tablet (100 mg total) by mouth daily.   prednisoLONE acetate (PRED FORTE) 1 % ophthalmic suspension Place 1 drop into the right eye every 3 (three) days.   triamterene-hydrochlorothiazide (MAXZIDE-25) 37.5-25 MG tablet Take 1 tablet by mouth daily. Taking 0.5 tablet.   No facility-administered medications prior to visit.    Review of Systems  All other systems reviewed and are negative.  All negative Except see HPI   {Insert previous labs (optional):23779} {See past labs  Heme  Chem  Endocrine   Serology  Results Review (optional):1}   Objective    BP 138/71 Comment: home reading 10:35 after taking amlodipine  5 mg  Pulse 65   Ht 5' 1.5 (1.562 m)   Wt 120 lb 9.6 oz (54.7 kg)   SpO2 100%   BMI 22.42 kg/m  {Insert last BP/Wt (optional):23777}{See vitals history (optional):1}   Physical Exam Vitals reviewed.  Constitutional:      General: She is not in acute distress.    Appearance: Normal appearance. She is well-developed. She is not diaphoretic.  HENT:     Head: Normocephalic and atraumatic.  Eyes:     General: No scleral icterus.    Conjunctiva/sclera: Conjunctivae normal.  Neck:     Thyroid: No thyromegaly.  Cardiovascular:     Rate and Rhythm: Normal rate and  regular rhythm.     Pulses: Normal pulses.     Heart sounds: Normal heart sounds. No murmur heard. Pulmonary:     Effort: Pulmonary effort is normal. No respiratory distress.     Breath sounds: Normal breath sounds. No wheezing, rhonchi or rales.  Musculoskeletal:     Cervical back: Neck supple.     Right lower leg: No edema.     Left lower leg: No edema.  Lymphadenopathy:     Cervical: No cervical adenopathy.  Skin:    General: Skin is warm and dry.     Findings: No rash.  Neurological:     Mental Status: She is alert and oriented to person, place, and time. Mental status is at baseline.  Psychiatric:        Mood and Affect: Mood normal.        Behavior: Behavior normal.      No results found for any visits on 09/29/23.      Assessment & Plan Hypertension with polyuria secondary to diuretic use Hypertension with elevated blood pressure, likely exacerbated by white coat syndrome. Polyuria after hydrochlorothiazide affects quality of life. Hesitant to take full tablet due to increased urination and potential incontinence. - Measure blood pressure in office and compare with home readings. - Continue amlodipine  and metoprolol . - Take full tablet of triamterene-hydrochlorothiazide 37.5-25 if  tolerated. Consider starting losartan if blood pressure stays high?  - Consider referral to hypertensive clinic if blood pressure remains elevated. - Discuss with primary care provider about medication adjustments.  Chronic venous insufficiency with lower extremity swelling Chronic venous insufficiency with lower extremity swelling. Advised to wear compression stockings but reports discomfort. History of varicose veins and previous vascular assessment. - Recommend wearing compression stockings, starting with a softer 15 mmHg and gradually increasing. - Consider referral to vascular surgeon for reassessment of venous insufficiency.  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 Reports blurred vision, possibly related to corneal condition. On prednisone every other day for this condition.  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  Elevated uric acid without clinical gout Uric acid level is 7.1, elevated without clinical symptoms of gout. Previous episodes of foot pain and redness treated with colchicine and allopurinol, but allopurinol caused adverse effects. - Monitor uric acid levels. - Avoid allopurinol due to previous adverse effects. Was evaluated by podiatry . Suspected venous insufficiency/ venous compromise of the great saphenous vein. Hx of malignant skin lesion great saphenous. Venous doppler showed no DVT XR of left foot from 08/11/23 showed no abnormal findings.  Elevated WBC&platelet count, under evaluation Elevated platelet count, possibly reactive due to previous infection. No evidence of clotting issues as D-dimer was negative. Concerned about elevated platelet count and its implications. - Repeat CBC to monitor platelet count.  Memory problems Consider  evaluating for memory loss at the follow-up Pt asked the same questions, does not remember previous conversation or recommendations Follow-up pcp   No orders of the defined types were placed in this encounter.   No follow-ups on file.   The patient was advised to call back or seek an in-person evaluation if the symptoms worsen or if the condition fails to improve as anticipated.  I discussed the assessment and treatment plan with the patient. The patient was provided an opportunity to ask questions and all were answered. The  patient agreed with the plan and demonstrated an understanding of the instructions.  I, Ivory Bail, PA-C have reviewed all documentation for this visit. The documentation on 09/29/2023  for the exam, diagnosis, procedures, and orders are all accurate and complete.  Jolynn Spencer, [facility], MMS [facility] [phone] (phone) [phone] (fax)  [facility]

## 2023-09-29 NOTE — Progress Notes (Deleted)
 Hypertension, follow-up  BP Readings from Last 3 Encounters:  09/15/23 (!) 150/60  06/15/23 138/76  04/14/23 130/64   Wt Readings from Last 3 Encounters:  09/15/23 122 lb 14.4 oz (55.7 kg)  08/11/23 122 lb (55.3 kg)  06/15/23 122 lb (55.3 kg)     She was last seen for hypertension 2 weeks ago.  BP at that visit was 150/60. Management since that visit includes to take a full tablet of triamterene/hydrochlorothiazide in the morning and continue other medication regimen. She reports {excellent/good/fair/poor:19665} compliance with treatment. She {is/is not:9024} having side effects. {document side effects if present:1} She is following a {diet:21022986} diet. She {is/is not:9024} exercising. She {does/does not:200015} smoke.  Use of agents associated with hypertension: {bp agents assoc with hypertension:511::none}.   Outside blood pressures are {***enter patient reported home BP readings, or 'not being checked':1}. Symptoms: {Yes/No:20286} chest pain {Yes/No:20286} chest pressure  {Yes/No:20286} palpitations {Yes/No:20286} syncope  {Yes/No:20286} dyspnea {Yes/No:20286} orthopnea  {Yes/No:20286} paroxysmal nocturnal dyspnea {Yes/No:20286} lower extremity edema   Pertinent labs No results found for: CHOL, HDL, LDLCALC, LDLDIRECT, TRIG, CHOLHDL Lab Results  Component Value Date   NA 134 09/15/2023   K 5.0 09/15/2023   CREATININE 0.77 09/15/2023   EGFR 80 09/15/2023   GLUCOSE 133 (H) 09/15/2023     The ASCVD Risk score (Arnett DK, et al., 2019) failed to calculate for the following reasons:   Cannot find a previous HDL lab   Cannot find a previous total cholesterol lab  ---------------------------------------------------------------------------------------------------

## 2023-09-30 DIAGNOSIS — R413 Other amnesia: Secondary | ICD-10-CM | POA: Insufficient documentation

## 2023-09-30 DIAGNOSIS — D72829 Elevated white blood cell count, unspecified: Secondary | ICD-10-CM | POA: Insufficient documentation

## 2023-09-30 DIAGNOSIS — E790 Hyperuricemia without signs of inflammatory arthritis and tophaceous disease: Secondary | ICD-10-CM | POA: Insufficient documentation

## 2023-09-30 DIAGNOSIS — H18511 Endothelial corneal dystrophy, right eye: Secondary | ICD-10-CM | POA: Insufficient documentation

## 2023-09-30 LAB — CBC WITH DIFFERENTIAL/PLATELET
Basophils Absolute: 0.2 x10E3/uL (ref 0.0–0.2)
Basos: 2 %
EOS (ABSOLUTE): 1 x10E3/uL — ABNORMAL HIGH (ref 0.0–0.4)
Eos: 7 %
Hematocrit: 48.9 % — ABNORMAL HIGH (ref 34.0–46.6)
Hemoglobin: 15.5 g/dL (ref 11.1–15.9)
Immature Grans (Abs): 0.1 x10E3/uL (ref 0.0–0.1)
Immature Granulocytes: 1 %
Lymphocytes Absolute: 1.5 x10E3/uL (ref 0.7–3.1)
Lymphs: 10 %
MCH: 30 pg (ref 26.6–33.0)
MCHC: 31.7 g/dL (ref 31.5–35.7)
MCV: 95 fL (ref 79–97)
Monocytes Absolute: 0.7 x10E3/uL (ref 0.1–0.9)
Monocytes: 5 %
Neutrophils Absolute: 10.9 x10E3/uL — ABNORMAL HIGH (ref 1.4–7.0)
Neutrophils: 75 %
Platelets: 576 x10E3/uL — ABNORMAL HIGH (ref 150–450)
RBC: 5.16 x10E6/uL (ref 3.77–5.28)
RDW: 17.9 % — ABNORMAL HIGH (ref 11.7–15.4)
WBC: 14.4 x10E3/uL — ABNORMAL HIGH (ref 3.4–10.8)

## 2023-10-01 ENCOUNTER — Telehealth: Payer: Self-pay

## 2023-10-01 NOTE — Progress Notes (Signed)
 Care Guide Pharmacy Note  10/01/2023 Name: Kim Cardenas MRN: [id number] DOB: Sep 24, 1945  Referred By: Sharma Coyer, MD Reason for referral: Complex Care Management and Call Attempt #1 (Unsuccessful initial outreach to schedule with PHARM D- Allyson)   Kim Cardenas is a 78 y.o. year old female who is a primary care patient of Simmons-Robinson, Coyer, MD.  Kim Cardenas was referred to the pharmacist for assistance related to: HTN  An unsuccessful telephone outreach was attempted today to contact the patient who was referred to the pharmacy team for assistance with disease management. Additional attempts will be made to contact the patient.  Kim Cardenas [facility], [facility] Guide  Direct Dial: [phone]  Fax [phone]

## 2023-10-01 NOTE — Progress Notes (Signed)
 Care Guide Pharmacy Note  10/01/2023 Name: Kim Cardenas MRN: [id number] DOB: 18-Oct-1945  Referred By: Sharma Coyer, MD Reason for referral: Complex Care Management, Call Attempt #1 (Unsuccessful initial outreach to schedule with PHARM D- Allyson), and Call Attempt #2 (Successful initial outreach scheduled with PHARM D- Manuelita)   Kim Cardenas is a 78 y.o. year old female who is a primary care patient of Simmons-Robinson, Makiera, MD.  Kim Cardenas was referred to the pharmacist for assistance related to: HTN  Successful contact was made with the patient to discuss pharmacy services including being ready for the pharmacist to call at least 5 minutes before the scheduled appointment time and to have medication bottles and any blood pressure readings ready for review. The patient agreed to meet with the pharmacist via telephone visit on (date/time).  10/08/23 @ 9 AM.   Kim Cardenas [facility]  [facility], [facility] Guide  Direct Dial: [phone]  Fax [phone]

## 2023-10-02 ENCOUNTER — Telehealth: Payer: Self-pay

## 2023-10-02 ENCOUNTER — Ambulatory Visit: Payer: Self-pay | Admitting: Physician Assistant

## 2023-10-02 DIAGNOSIS — D72828 Other elevated white blood cell count: Secondary | ICD-10-CM

## 2023-10-02 NOTE — Telephone Encounter (Signed)
 Copied from CRM [id number]. Topic: Clinical - Lab/Test Results >> Oct 02, 2023 10:58 AM Emylou G wrote: Reason for CRM: Please call patient.. wants to go over latest labs from the 19th.. Would like a call back.. to go over labs.SABRA

## 2023-10-02 NOTE — Progress Notes (Signed)
 Brief Telephone Documentation Reason for Call: Medication management  Summary of Call: Patient called back regarding clinical pharmacist appointment. Patient vocalized some confusion with the reason for the appointment. 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  Explained the purpose of the upcoming pharmacist appointment. Patient agreeable to come in person for management. She would like to push appointment out for a few weeks from now, however.   Follow Up: Patient given direct line for further questions/concerns.  Agastya Meister E. Marsh, PharmD Clinical Pharmacist [facility] [phone]

## 2023-10-02 NOTE — Progress Notes (Signed)
   10/02/2023  Kim Cardenas  DOB: 10-05-1945 MRN: [id number]  Attempted to contact patient for medication management/review. Left HIPAA compliant message for patient to return my call at their convenience.   Appointment scheduled with clinical pharmacist via telehealth. Called to switch to in-person.    Tiffanni Scarfo E. Marsh, PharmD Clinical Pharmacist [facility] [phone]

## 2023-10-06 ENCOUNTER — Other Ambulatory Visit: Payer: Self-pay

## 2023-10-06 DIAGNOSIS — D72828 Other elevated white blood cell count: Secondary | ICD-10-CM

## 2023-10-06 NOTE — Telephone Encounter (Signed)
 Would recommend additional abx therapy only if patient is experiencing increased redness or pain concerning cellulitis otherwise we will re-evaluate during her appt in Sept

## 2023-10-06 NOTE — Telephone Encounter (Signed)
 LVMTCB. Ok to advise per providers

## 2023-10-07 ENCOUNTER — Telehealth: Payer: Self-pay | Admitting: Lab

## 2023-10-07 NOTE — Telephone Encounter (Signed)
 Copied from CRM [id number]. Topic: General - Other >> Oct 07, 2023 11:45 AM Treva T wrote: Reason for CRM: Received call from patient, states she is returning call to office, [place].  Called office, no response, per [facility], closed for lunch.  Patient requesting a return call to discuss previous message left by office.  Can be reached at [phone].  Patient is aware of same day call back.  Thank you.

## 2023-10-07 NOTE — Telephone Encounter (Signed)
 Patient contacted [facility] stating that our office never called her with ultrasound results that she had done back on 08/19/2023 please contact patient.

## 2023-10-07 NOTE — Telephone Encounter (Signed)
 Copied from CRM [id number]. Topic: General - Other >> Oct 06, 2023  5:43 PM Kim Cardenas wrote: Reason for CRM: patient returning call from office, please call tomorrow [phone]

## 2023-10-08 ENCOUNTER — Other Ambulatory Visit

## 2023-10-08 NOTE — Telephone Encounter (Signed)
 Called patient back. LMTCB of for E2C2 to give provider's message to patient.  Would recommend additional abx therapy only if patient is experiencing increased redness or pain concerning cellulitis otherwise we will re-evaluate during her appt in Sept

## 2023-10-08 NOTE — Telephone Encounter (Signed)
 Ostwalt, Janna, PA-C to [facility] [facility]     10/06/23  5:15 PM I talked with Dr. Lang. Because your symptoms improved, she suggested to hold on antibiotics. Let us  see what hematology will say.   Best, Jolynn Spencer, [facility], MMS [facility] [phone] (phone) [phone] (fax)  Last read by Kate Maryruth Pepper at 7:50AM on 10/07/2023.

## 2023-10-09 NOTE — Telephone Encounter (Signed)
 Copied from CRM #[id number]. Topic: General - Other >> Oct 08, 2023  5:34 PM Delon DASEN wrote: Reason for CRM: patent returning call from office-[phone]

## 2023-10-09 NOTE — Telephone Encounter (Signed)
 Copied from CRM #[id number]. Topic: General - Other >> Oct 09, 2023  2:57 PM Yolanda T wrote: Reason for CRM: patient called to speak with someone in the office. There was no answer, please f/u with patient

## 2023-10-19 ENCOUNTER — Inpatient Hospital Stay: Admitting: Oncology

## 2023-10-19 ENCOUNTER — Inpatient Hospital Stay

## 2023-10-28 ENCOUNTER — Ambulatory Visit (INDEPENDENT_AMBULATORY_CARE_PROVIDER_SITE_OTHER)

## 2023-10-28 ENCOUNTER — Ambulatory Visit (INDEPENDENT_AMBULATORY_CARE_PROVIDER_SITE_OTHER): Admitting: Family Medicine

## 2023-10-28 ENCOUNTER — Encounter: Payer: Self-pay | Admitting: Family Medicine

## 2023-10-28 VITALS — BP 142/60 | HR 64 | Temp 98.1°F | Ht 61.5 in

## 2023-10-28 VITALS — BP 161/68 | HR 63

## 2023-10-28 DIAGNOSIS — R739 Hyperglycemia, unspecified: Secondary | ICD-10-CM

## 2023-10-28 DIAGNOSIS — K5904 Chronic idiopathic constipation: Secondary | ICD-10-CM

## 2023-10-28 DIAGNOSIS — I10 Essential (primary) hypertension: Secondary | ICD-10-CM

## 2023-10-28 DIAGNOSIS — E559 Vitamin D deficiency, unspecified: Secondary | ICD-10-CM | POA: Diagnosis not present

## 2023-10-28 DIAGNOSIS — R413 Other amnesia: Secondary | ICD-10-CM | POA: Diagnosis not present

## 2023-10-28 DIAGNOSIS — R5383 Other fatigue: Secondary | ICD-10-CM

## 2023-10-28 DIAGNOSIS — G479 Sleep disorder, unspecified: Secondary | ICD-10-CM | POA: Diagnosis not present

## 2023-10-28 MED ORDER — VALSARTAN 80 MG PO TABS: 80.0000 mg | ORAL_TABLET | Freq: Every day | ORAL | 1 refills | Status: DC

## 2023-10-28 NOTE — Progress Notes (Signed)
 Established patient visit   Patient: Kim Cardenas   DOB: May 09, 1945   78 y.o. Female  MRN: [id number] Visit Date: 10/28/2023  Today's healthcare provider: [name], MD   Chief Complaint  Patient presents with   Medical Management of Chronic Issues    Patient is present for f/u htn, elevated white count with last CBC   Hypertension    Blood pressure was elevated when patient was meeting with pharmacist today    Subjective     HPI     Medical Management of Chronic Issues    Additional comments: Patient is present for f/u htn, elevated white count with last CBC        Hypertension    Additional comments: Blood pressure was elevated when patient was meeting with pharmacist today       Last edited by Cherry Chiquita HERO, CMA on 10/28/2023  3:08 PM.       Discussed the use of AI scribe software for clinical note transcription with the patient, who gave verbal consent to proceed.  History of Present Illness Kim Cardenas is a 78 year old female with chronic hypertension who presents for follow-up.  Her hypertension has been poorly controlled, with elevated blood pressure readings noted today. She has not been consistently taking her prescribed triamterene and was started on valsartan  80 mg. She also takes amlodipine  5 mg. She experiences weakness and low energy, which improves after eating.  She has a history of elevated white blood cell count and platelets, previously treated with cephalexin  for suspected cellulitis. She did not take prednisone and was referred to hematology but has not yet attended a scheduled appointment.  She experiences constipation, having bowel movements every two to three days, which are sometimes difficult. She uses Miralax, which helps but causes stomach discomfort. She reports a sensation of needing to have a bowel movement shortly after going, but is unable to. No blood in her stool. She has a varied diet including salads,  chicken, and salmon.  She reports a sensation of fullness on the right side, which is not painful but noticeable when standing. She also reports her stool sometimes floats.  She has a history of a squamous cell carcinoma lesion on her leg, which was removed. Post-surgery, she experienced significant bleeding and pain in her toes, which was initially suspected to be gout but improved with cephalexin . She has not been on colchicine or other gout medications.  She wakes every two hours at night to urinate and takes half of a 2.5 mg Xanax for sleep.  Previous provider mentioned concerns for memory changes for the patient but she appears oriented during today's visit, demonstrating intact memory, she does not repeat questions that have been previously answered during this visit.     Past Medical History:  Diagnosis Date   Allergy    Anxiety    Cataract    Hypertension    Osteoporosis    Thyroid disease     Medications: Outpatient Medications Prior to Visit  Medication Sig   ALPRAZolam (XANAX) 0.25 MG tablet Take 0.25 mg by mouth at bedtime as needed for sleep.   amLODipine  (NORVASC ) 5 MG tablet Take 1 tablet by mouth once daily   ascorbic acid (VITAMIN C) 500 MG tablet Take 500 mg by mouth daily.   aspirin EC 81 MG tablet Take 81 mg by mouth daily.   Cholecalciferol (VITAMIN D -1000 MAX ST) 25 MCG (1000 UT) tablet Take 1,000 Units by  mouth daily.   levothyroxine  (SYNTHROID ) 88 MCG tablet Take 88 mcg by mouth daily before breakfast.   metoprolol  succinate (TOPROL -XL) 100 MG 24 hr tablet Take 1 tablet (100 mg total) by mouth daily.   prednisoLONE acetate (PRED FORTE) 1 % ophthalmic suspension Place 1 drop into the right eye every 3 (three) days.   valsartan  (DIOVAN ) 80 MG tablet Take 1 tablet (80 mg total) by mouth daily.   No facility-administered medications prior to visit.    Review of Systems  Last CBC Lab Results  Component Value Date   WBC 14.4 (H) 09/29/2023   HGB 15.5  09/29/2023   HCT 48.9 (H) 09/29/2023   MCV 95 09/29/2023   MCH 30.0 09/29/2023   RDW 17.9 (H) 09/29/2023   PLT 576 (H) 09/29/2023   Last metabolic panel Lab Results  Component Value Date   GLUCOSE 133 (H) 09/15/2023   NA 134 09/15/2023   K 5.0 09/15/2023   CL 95 (L) 09/15/2023   CO2 26 09/15/2023   BUN 18 09/15/2023   CREATININE 0.77 09/15/2023   EGFR 80 09/15/2023   CALCIUM 9.7 09/15/2023    No results found for: HGBA1C  No results found for: VITAMINB12      Objective    BP (!) 142/60 (BP Location: Left Arm, Patient Position: Sitting, Cuff Size: Normal)   Pulse 64   Temp 98.1 F (36.7 C) (Oral)   Ht 5' 1.5 (1.562 m)   SpO2 97%   BMI 22.42 kg/m   BP Readings from Last 3 Encounters:  10/28/23 (!) 142/60  10/28/23 (!) 161/68  09/29/23 (!) 171/83   Wt Readings from Last 3 Encounters:  09/29/23 120 lb 9.6 oz (54.7 kg)  09/15/23 122 lb 14.4 oz (55.7 kg)  08/11/23 122 lb (55.3 kg)        Physical Exam  Physical Exam VITALS: BP- 142/60 GENITOURINARY: Fullness in the right inguinal region. SKIN: Incision is healing well. CARD: RRR  PULM: CTAB     No results found for any visits on 10/28/23.  Assessment & Plan     Problem List Items Addressed This Visit     Memory problem   Relevant Orders   Vitamin B12   TSH + free T4   Primary hypertension - Primary   Relevant Orders   Microalbumin / creatinine urine ratio   CBC w/Diff/Platelet   Vitamin D  insufficiency   Other Visit Diagnoses       Elevated random blood glucose level         Feeling tired       Relevant Orders   CBC w/Diff/Platelet   Hemoglobin A1c   Vitamin B12   VITAMIN D  25 Hydroxy (Vit-D Deficiency, Fractures)   TSH + free T4     Chronic idiopathic constipation       Relevant Orders   Ambulatory referral to Gastroenterology       Assessment and Plan Assessment & Plan Hypertension, uncontrolled Chronic condition  Uncontrolled hypertension with recent readings of  161/unknown and 142/60. Non-compliance with triamterene/hydrochlorothiazide. Valsartan  80 mg initiated by pharmacist. Amlodipine  5 mg continued. Triamterene/hydrochlorothiazide replaced with valsartan  to manage elevated blood pressure and avoid urinary frequency. - Continue valsartan  80 mg daily - Continue amlodipine  5 mg daily - Discontinue triamterene/hydrochlorothiazide - Recheck blood pressure at next visit  Weakness/fatigue, under evaluation Intermittent weakness and fatigue, sometimes relieved by eating. - Order A1c to assess average blood glucose levels  Leukocytosis and thrombocytosis, under evaluation Previous elevated white blood  cell and platelet counts. Referral to hematology pending. Plan to recheck CBC with differential and platelet count. - Order CBC with differential and platelet count - Encourage follow-up with hematology if abnormalities persist  Constipation Chronic constipation with bowel movements every 2-3 days, difficult to pass, causing discomfort and sensation of incomplete evacuation. Miralax provides some relief but causes stomach rumbling. - Increase Miralax to twice daily - Refer to gastroenterology for further evaluation and management - Consider colonoscopy after gastroenterology consultation  Right inguinal hernia? Possible right inguinal hernia with fullness in the right lower quadrant.  Venous insufficiency of lower extremity Venous insufficiency with visible veins on lower extremity. No symptoms of cellulitis or significant discomfort.  Squamous cell carcinoma of skin, status post excision Squamous cell carcinoma on leg excised by Dr. Karoline. Post-operative bleeding managed with lidocaine and epinephrine. No current issues with excision site.  Sleep disturbance Sleep disturbance with waking every two hours, often to urinate. Current use of Xanax for sleep. Discussion of magnesium supplementation to aid sleep. - Continue Xanax as prescribed - Add  magnesium 400 mg daily, preferably at bedtime     No follow-ups on file.         [name], MD  [facility] [phone] (phone) [phone] (fax)  [facility]

## 2023-10-28 NOTE — Progress Notes (Signed)
   S:     Chief Complaint  Patient presents with   Hypertension   78 y.o. female who presents for hypertension evaluation, education, and management.   Patient was referred by Janna Ostwalt on 09/29/23.   At last visit, BP was elevated at 171/83 mmHg. Patient reported normal home BP readings.   Today, patient reports issues nocturia multiple times a night. Reports difficulty with staying asleep vs falling asleep.   PMH is significant for osteoporosis, hypothyroidism, HTN, and VitD deficiency.   Today, patient arrives in good spirits and presents without assistance. Denies dizziness, lightheadedness, headache, blurred vision, swelling, chest pain.  Family/Social history:  -Tobacco: denies -Alcohol: occasional  Medication adherence appropriate, however, she will skip her fluid pill a few times per week as she is unable to hold her bladder. Patient reports taking blood pressure medications today.   Current antihypertensives include: amlodipine  5 mg daily, Toprol  XL 100 mg daily, triamterene/hydrochlorothiazide 37.5/25 mg 1/2 tablet daily  Antihypertensives tried in the past include: none  Reported home blood pressure readings: 125-142/64-72 mmHg BEFORE MEDS  Patient reported dietary habits: Eats 3 meals/day Working to limit sodium. Typically eats a TV dinner  Patient-reported exercise habits: none currently; used to go to the gym before COVID  O:  Last 3 Office BP readings: BP Readings from Last 3 Encounters:  10/28/23 (!) 161/68  09/29/23 (!) 171/83  09/15/23 (!) 150/60    BMET    Component Value Date/Time   NA 134 09/15/2023 1544   K 5.0 09/15/2023 1544   CL 95 (L) 09/15/2023 1544   CO2 26 09/15/2023 1544   GLUCOSE 133 (H) 09/15/2023 1544   BUN 18 09/15/2023 1544   CREATININE 0.77 09/15/2023 1544   CALCIUM 9.7 09/15/2023 1544    Renal function: CrCl cannot be calculated (Patient's most recent lab result is older than the maximum 21 days allowed.).  Clinical  ASCVD: No  The ASCVD Risk score (Arnett DK, et al., 2019) failed to calculate for the following reasons:   Cannot find a previous HDL lab   Cannot find a previous total cholesterol lab  A/P: Hypertension diagnosis currently uncontrolled on current medications. BP goal < 130/80 mmHg. Medication adherence appears inconsistent for triamterene/hydrochlorothiazide d/t reports of frequent urination. Patient reports nocturia multiple times a night as well. Home BP readings are close to goal, but are taken prior to medications. Home BP machine brought to clinic today for verification with ~10 mmHg point difference. Home cuff appears to be too small. Will discontinue fluid pill and initiate valsartan  in its place.  -Discontinued triamterene/hydrochlorothiazide . -Started valsartan  80 mg daily  -Continued amlodipine  5 mg daily -Continued metoprolol  XL 100 mg daily -Recommended patient to obtain a smaller Omron cuff (adult small). Continue monitoring BP at home ~1 hour AFTER medications. Counseled on proper use of home BP cuff.  -Patient educated on purpose, proper use, and potential adverse effects of valsartan .  -Counseled on lifestyle modifications for blood pressure control including reduced dietary sodium, increased exercise, adequate sleep.  Insomnia: Reports difficulty with staying asleep d/t nocturia. Will discontinue fluid pill at this time to hopefully lessen nocturia.  -Recommend OTC magnesium glycinate   Written patient instructions provided. Patient verbalized understanding of treatment plan.  Total time in face to face counseling 45 minutes.    Follow-up:  Pharmacist 12/02/23. PCP clinic visit today.   Kim Cardenas, PharmD Clinical Pharmacist [facility] [phone]

## 2023-10-28 NOTE — Patient Instructions (Addendum)
 To keep you healthy, please keep in mind the following health maintenance items that you are due for:   Health Maintenance Due  Topic Date Due   Medicare Annual Wellness (AWV)  Never done   Hepatitis C Screening  Never done   DTaP/Tdap/Td (1 - Tdap) Never done   Pneumococcal Vaccine: 50+ Years (1 of 2 - PCV) Never done   Zoster Vaccines- Shingrix (1 of 2) 01/12/1967   Colonoscopy  Never done   COVID-19 Vaccine (2 - Moderna risk series) 01/27/2022   Influenza Vaccine  09/11/2023     Best Wishes,   Dr. Lang     VISIT SUMMARY: Today, we addressed your uncontrolled hypertension, weakness and fatigue, elevated white blood cell and platelet counts, chronic constipation, possible right inguinal hernia, venous insufficiency, history of squamous cell carcinoma, and sleep disturbances.  YOUR PLAN: HYPERTENSION, UNCONTROLLED: Your blood pressure has been high, and you have not been consistently taking your prescribed medication. -Start taking valsartan  80 mg daily. -Continue taking amlodipine  5 mg daily. -Stop taking triamterene/hydrochlorothiazide. -We will recheck your blood pressure at your next visit.  WEAKNESS/FATIGUE: You have been experiencing weakness and fatigue, which sometimes improves after eating. -We will check your A1c to assess your average blood glucose levels.  LEUKOCYTOSIS AND THROMBOCYTOSIS: You have had elevated white blood cell and platelet counts in the past. -We will recheck your CBC with differential and platelet count. -Please follow up with hematology if the abnormalities persist.  CONSTIPATION: You have chronic constipation with bowel movements every 2-3 days, which are difficult to pass. -Increase Miralax to twice daily. -We will refer you to gastroenterology for further evaluation and management. -Consider a colonoscopy after your gastroenterology consultation.  RIGHT INGUINAL HERNIA: You have a sensation of fullness on the right side, which may be a  hernia. -We will monitor this condition.  VENOUS INSUFFICIENCY OF LOWER EXTREMITY: You have visible veins on your lower extremity but no significant discomfort. -No immediate treatment is necessary unless symptoms worsen.  SQUAMOUS CELL CARCINOMA OF SKIN, STATUS POST EXCISION: You had a squamous cell carcinoma removed from your leg, and there are no current issues with the excision site. -No further treatment is needed at this time.  SLEEP DISTURBANCE: You wake up every two hours at night, often to urinate, and take Xanax for sleep. -Continue taking Xanax as prescribed. -Add magnesium 400 mg daily, preferably at bedtime.                      Contains text generated by [employer]bridge.                                 Contains text generated by [employer]bridge.

## 2023-10-28 NOTE — Patient Instructions (Signed)
 Thanks for visiting with me today!  Stop triamterene/hydrochlorothiazide  Start valsartan  80 mg daily Continue amlodipine  5 mg daily  Continue metoprolol  succinate 100 mg daily  You can pick up magnesium glycinate

## 2023-10-29 LAB — CBC WITH DIFFERENTIAL/PLATELET
Basophils Absolute: 0.2 x10E3/uL (ref 0.0–0.2)
Basos: 1 %
EOS (ABSOLUTE): 1 x10E3/uL — ABNORMAL HIGH (ref 0.0–0.4)
Eos: 7 %
Hematocrit: 48.7 % — ABNORMAL HIGH (ref 34.0–46.6)
Hemoglobin: 16 g/dL — ABNORMAL HIGH (ref 11.1–15.9)
Immature Grans (Abs): 0.1 x10E3/uL (ref 0.0–0.1)
Immature Granulocytes: 1 %
Lymphocytes Absolute: 1.6 x10E3/uL (ref 0.7–3.1)
Lymphs: 10 %
MCH: 30.8 pg (ref 26.6–33.0)
MCHC: 32.9 g/dL (ref 31.5–35.7)
MCV: 94 fL (ref 79–97)
Monocytes Absolute: 0.7 x10E3/uL (ref 0.1–0.9)
Monocytes: 5 %
Neutrophils Absolute: 11.4 x10E3/uL — ABNORMAL HIGH (ref 1.4–7.0)
Neutrophils: 76 %
Platelets: 557 x10E3/uL — ABNORMAL HIGH (ref 150–450)
RBC: 5.2 x10E6/uL (ref 3.77–5.28)
RDW: 17.7 % — ABNORMAL HIGH (ref 11.7–15.4)
WBC: 15 x10E3/uL — ABNORMAL HIGH (ref 3.4–10.8)

## 2023-10-29 LAB — TSH+FREE T4
Free T4: 1.39 ng/dL (ref 0.82–1.77)
TSH: 8.54 u[IU]/mL — ABNORMAL HIGH (ref 0.450–4.500)

## 2023-10-29 LAB — VITAMIN D 25 HYDROXY (VIT D DEFICIENCY, FRACTURES): Vit D, 25-Hydroxy: 75.7 ng/mL (ref 30.0–100.0)

## 2023-10-29 LAB — MICROALBUMIN / CREATININE URINE RATIO
Creatinine, Urine: 37.3 mg/dL
Microalb/Creat Ratio: <8 mg/g{creat} (ref 0–29)
Microalbumin, Urine: <3 ug/mL

## 2023-10-29 LAB — VITAMIN B12: Vitamin B-12: 442 pg/mL (ref 232–1245)

## 2023-10-29 LAB — HEMOGLOBIN A1C
Est. average glucose Bld gHb Est-mCnc: 105 mg/dL
Hgb A1c MFr Bld: 5.3 % (ref 4.8–5.6)

## 2023-11-02 ENCOUNTER — Ambulatory Visit: Payer: Self-pay | Admitting: Family Medicine

## 2023-11-02 ENCOUNTER — Telehealth: Payer: Self-pay

## 2023-11-02 DIAGNOSIS — E039 Hypothyroidism, unspecified: Secondary | ICD-10-CM

## 2023-11-02 MED ORDER — LEVOTHYROXINE SODIUM 100 MCG PO TABS: 100.0000 ug | ORAL_TABLET | Freq: Every day | ORAL | 3 refills | Status: DC

## 2023-11-02 NOTE — Progress Notes (Addendum)
 Brief Telephone Documentation Reason for Call: Patient left message regarding question for pharmacist   Summary of Call: Called patient to discuss new antihypertensive medications prescribed at last visit.  Patient reports minimal change in urinary frequency since discontinuation of diuretic combination medication. She has not yet started valsartan  at this time.   Reported home BP readings range from 120-143/65-67 mmHg. Of note, she is checking multiple times per day both before and after medications. Encouraged patient to check once daily at least an hour after medications as well as if she experiences any lightheadedness or dizziness. Encouraged patient to avoid excess BP monitoring as it could be inducing stress. Encouraged patient to obtain small BP cuff for monitor as well.   Follow Up: Patient given direct line for further questions/concerns.  Siena Poehler E. Marsh, PharmD Clinical Pharmacist [facility] [phone]

## 2023-11-03 NOTE — Progress Notes (Signed)
 Seen by patient Kim Cardenas on 11/02/2023 10:43 AM

## 2023-11-17 ENCOUNTER — Inpatient Hospital Stay: Attending: Oncology | Admitting: Oncology

## 2023-11-17 ENCOUNTER — Inpatient Hospital Stay

## 2023-11-17 ENCOUNTER — Encounter: Payer: Self-pay | Admitting: Oncology

## 2023-11-17 VITALS — BP 169/59 | HR 67 | Temp 97.5°F | Resp 20 | Ht 61.5 in | Wt 118.5 lb

## 2023-11-17 DIAGNOSIS — Z79899 Other long term (current) drug therapy: Secondary | ICD-10-CM | POA: Insufficient documentation

## 2023-11-17 DIAGNOSIS — D7219 Other eosinophilia: Secondary | ICD-10-CM

## 2023-11-17 DIAGNOSIS — M810 Age-related osteoporosis without current pathological fracture: Secondary | ICD-10-CM | POA: Diagnosis not present

## 2023-11-17 DIAGNOSIS — D471 Chronic myeloproliferative disease: Secondary | ICD-10-CM | POA: Diagnosis not present

## 2023-11-17 DIAGNOSIS — D45 Polycythemia vera: Secondary | ICD-10-CM | POA: Diagnosis not present

## 2023-11-17 DIAGNOSIS — D72828 Other elevated white blood cell count: Secondary | ICD-10-CM

## 2023-11-17 LAB — CBC WITH DIFFERENTIAL (CANCER CENTER ONLY)
Abs Immature Granulocytes: 0.18 K/uL — ABNORMAL HIGH (ref 0.00–0.07)
Basophils Absolute: 0.2 K/uL — ABNORMAL HIGH (ref 0.0–0.1)
Basophils Relative: 1 %
Eosinophils Absolute: 0.9 K/uL — ABNORMAL HIGH (ref 0.0–0.5)
Eosinophils Relative: 6 %
HCT: 48.3 % — ABNORMAL HIGH (ref 36.0–46.0)
Hemoglobin: 15.9 g/dL — ABNORMAL HIGH (ref 12.0–15.0)
Immature Granulocytes: 1 %
Lymphocytes Relative: 9 %
Lymphs Abs: 1.4 K/uL (ref 0.7–4.0)
MCH: 30.6 pg (ref 26.0–34.0)
MCHC: 32.9 g/dL (ref 30.0–36.0)
MCV: 93.1 fL (ref 80.0–100.0)
Monocytes Absolute: 0.7 K/uL (ref 0.1–1.0)
Monocytes Relative: 4 %
Neutro Abs: 12.3 K/uL — ABNORMAL HIGH (ref 1.7–7.7)
Neutrophils Relative %: 79 %
Platelet Count: 745 K/uL — ABNORMAL HIGH (ref 150–400)
RBC: 5.19 MIL/uL — ABNORMAL HIGH (ref 3.87–5.11)
RDW: 18.6 % — ABNORMAL HIGH (ref 11.5–15.5)
WBC Count: 15.6 K/uL — ABNORMAL HIGH (ref 4.0–10.5)
nRBC: 0 % (ref 0.0–0.2)

## 2023-11-17 NOTE — Progress Notes (Signed)
 Hematology/Oncology Consult note [facility] Telephone:([phone] [phone]  Patient Care Team: Sharma Coyer, MD as PCP - General (Family Medicine) Damian Therisa HERO, MD as Physician Assistant (Endocrinology)   Name of the patient: Kim Cardenas  [id number]  1945-06-01    Reason for referral-leukocytosis   Referring physician-Dr. Marcine Essex  Date of visit: 11/17/23   History of presenting illness-patient is a 78 year old female with a past medical history significant for osteoporosis hypothyroidism vitamin D  deficiency and hypertension among other Medical problems.  She has been referred for leukocytosis.  CBC from 10/28/2023 showed white cell count of 15, H&H of 16/48.7 and a platelet count of 557.  She had similar counts 2 months ago as well.  Differential mainly shows neutrophilia and eosinophilia.  I do not have any prior counts for comparison.  Patient states that she used to get her blood work at a private clinic in [place] and does not have access to those lab tests.  She has a good appetite and stable weight, although she notes a weight decrease of about two pounds overnight. She experiences mild night sweats, primarily feeling hot under her arms and around her chest.  She has a history of donating stem cells for her brother in [place]. 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 She mentions a past skin cancer incident where she experienced unexpected bleeding despite high platelet counts, which was managed with lidocaine and epinephrine injections.  ECOG PS- 1  Pain scale- 0   Review of systems- Review of Systems  Constitutional:  Negative for chills, fever, malaise/fatigue and weight loss.  HENT:  Negative for congestion, ear discharge and nosebleeds.   Eyes:  Negative for blurred vision.  Respiratory:  Negative for cough, hemoptysis, sputum production, shortness of breath and wheezing.    Cardiovascular:  Negative for chest pain, palpitations, orthopnea and claudication.  Gastrointestinal:  Negative for abdominal pain, blood in stool, constipation, diarrhea, heartburn, melena, nausea and vomiting.  Genitourinary:  Negative for dysuria, flank pain, frequency, hematuria and urgency.  Musculoskeletal:  Negative for back pain, joint pain and myalgias.  Skin:  Negative for rash.  Neurological:  Negative for dizziness, tingling, focal weakness, seizures, weakness and headaches.  Endo/Heme/Allergies:  Does not bruise/bleed easily.  Psychiatric/Behavioral:  Negative for depression and suicidal ideas. The patient does not have insomnia.     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 03/11/2023   Nodule of skin of left forearm 03/11/2023   Hypothyroidism, acquired 11/12/2018   Osteoporosis, post-menopausal 11/12/2018   Vitamin D  insufficiency 11/12/2018   Squamous cell skin cancer 07/16/2016     Past Medical History:  Diagnosis Date   Allergy    Anxiety    Cataract    Hypertension    Osteoporosis    Thyroid disease      Past Surgical History:  Procedure Laterality Date   CORNEAL  TRANSPLANT Right 2020   EYE SURGERY Bilateral     Social History   Socioeconomic History   Marital status: Single    Spouse name: Not on file   Number of children: Not on file   Years of education: Not on file    Highest education level: Not on file  Occupational History   Not on file  Tobacco Use   Smoking status: Never   Smokeless tobacco: Never  Vaping Use   Vaping status: Never Used  Substance and Sexual Activity   Alcohol use: Not Currently    Comment: occasional   Drug use: Never   Sexual activity: Not Currently  Other Topics Concern   Not on file  Social History Narrative   Not on file   Social Drivers of Health   Financial Resource Strain: Low Risk  (04/14/2023)   Overall Financial Resource Strain (CARDIA)    Difficulty of Paying Living Expenses: Not hard at all  Food Insecurity: No Food Insecurity (11/17/2023)   Hunger Vital Sign    Worried About Running Out of Food in the Last Year: Never true    Ran Out of Food in the Last Year: Never true  Transportation Needs: No Transportation Needs (11/17/2023)   PRAPARE - Administrator, Civil Service (Medical): No    Lack of Transportation (Non-Medical): No  Physical Activity: Not on file  Stress: No Stress Concern Present (04/14/2023)   [employer] of Occupational Health - Occupational Stress Questionnaire    Feeling of Stress : Only a little  Social Connections: Not on file  Intimate Partner Violence: Not At Risk (11/17/2023)   Humiliation, Afraid, Rape, and Kick questionnaire    Fear of Current or Ex-Partner: No    Emotionally Abused: No    Physically Abused: No    Sexually Abused: No     Family History  Problem Relation Age of Onset   Heart Problems Mother    Lung cancer Father      Current Outpatient Medications:    ALPRAZolam (XANAX) 0.25 MG tablet, Take 0.25 mg by mouth at bedtime as needed for sleep., Disp: , Rfl:    amLODipine  (NORVASC ) 5 MG tablet, Take 1 tablet by mouth once daily, Disp: 90 tablet, Rfl: 0   ascorbic acid (VITAMIN C) 500 MG tablet, Take 500 mg by mouth daily., Disp: , Rfl:    aspirin EC 81 MG tablet, Take 81 mg by mouth daily., Disp: , Rfl:    Cholecalciferol (VITAMIN D -1000 MAX ST)  25 MCG (1000 UT) tablet, Take 1,000 Units by mouth daily., Disp: , Rfl:    levothyroxine  (SYNTHROID ) 100 MCG tablet, Take 1 tablet (100 mcg total) by mouth daily., Disp: 30 tablet, Rfl: 3   metoprolol  succinate (TOPROL -XL) 100 MG 24 hr tablet, Take 1 tablet (100 mg total) by mouth daily., Disp: 60 tablet, Rfl: 1   prednisoLONE acetate (PRED FORTE) 1 % ophthalmic suspension, Place 1 drop into the right eye every 3 (three) days., Disp: , Rfl:    valsartan  (DIOVAN ) 80 MG tablet, Take 1 tablet (80 mg total) by mouth daily., Disp: 30 tablet, Rfl: 1   Physical exam:  Vitals:   11/17/23 1404  BP: (!) 169/59  Pulse: 67  Resp: 20  Temp: (!) 97.5 F (36.4 C)  TempSrc: Tympanic  SpO2: 96%  Weight: 118 lb 8 oz (53.8 kg)  Height: 5' 1.5 (1.562 m)   Physical Exam Cardiovascular:     Rate and Rhythm: Normal rate  and regular rhythm.     Heart sounds: Normal heart sounds.  Pulmonary:     Effort: Pulmonary effort is normal.     Breath sounds: Normal breath sounds.  Abdominal:     General: Bowel sounds are normal.     Palpations: Abdomen is soft.     Comments: No palpable hepatosplenomegaly  Lymphadenopathy:     Comments: No palpable cervical, supraclavicular, axillary or inguinal adenopathy    Skin:    General: Skin is warm and dry.  Neurological:     Mental Status: She is alert and oriented to person, place, and time.           Latest Ref Rng & Units 09/15/2023    3:44 PM  CMP  Glucose 70 - 99 mg/dL 866   BUN 8 - 27 mg/dL 18   Creatinine 9.42 - 1.00 mg/dL 9.22   Sodium 865 - 855 mmol/L 134   Potassium 3.5 - 5.2 mmol/L 5.0   Chloride 96 - 106 mmol/L 95   CO2 20 - 29 mmol/L 26   Calcium 8.7 - 10.3 mg/dL 9.7       Latest Ref Rng & Units 10/28/2023    3:58 PM  CBC  WBC 3.4 - 10.8 x10E3/uL 15.0   Hemoglobin 11.1 - 15.9 g/dL 83.9   Hematocrit 65.9 - 46.6 % 48.7   Platelets 150 - 450 x10E3/uL 557      Assessment and plan- Patient is a 78 y.o. female referred for leukocytosis  mainly neutrophilia and eosinophilia  Discussed with the patient that elevated white cell count mainly neutrophilia and eosinophilia can be either reactive or seen with conditions such as myeloproliferative neoplasms.will check peripheral flow cytometry, epo levels, BCR-ABL FISH testing as well as JAK2, CALR, MPL mutation testing given that she has borderline polycythemia and thrombocytosis as well.  I will see her back in 2 weeks time to discuss the results of blood work and further management   Thank you for this kind referral and the opportunity to participate in the care of this patient   Visit Diagnosis 1. Neutrophilia   2. Other eosinophilia     Dr. Annah Skene, MD, MPH [facility] [phone] 11/17/2023 2:28 PM

## 2023-11-17 NOTE — Progress Notes (Signed)
 Patient is a new patient, here for elevated white blood cell.

## 2023-11-18 LAB — ERYTHROPOIETIN: Erythropoietin: 3 m[IU]/mL (ref 2.6–18.5)

## 2023-11-23 LAB — COMP PANEL: LEUKEMIA/LYMPHOMA

## 2023-11-24 ENCOUNTER — Ambulatory Visit: Payer: Self-pay | Admitting: Oncology

## 2023-11-24 LAB — CALRETICULIN (CALR) MUTATION ANALYSIS

## 2023-11-24 LAB — JAK2 V617F RFX CALR/MPL/E12-15: JAK2 V617F %: 79.96 %

## 2023-11-24 LAB — BCR-ABL1 FISH
Cells Analyzed: 200
Cells Counted: 200

## 2023-11-25 ENCOUNTER — Other Ambulatory Visit: Payer: Self-pay | Admitting: Family Medicine

## 2023-11-25 ENCOUNTER — Telehealth: Payer: Self-pay

## 2023-11-25 DIAGNOSIS — I10 Essential (primary) hypertension: Secondary | ICD-10-CM

## 2023-11-25 LAB — MPL MUTATION ANALYSIS

## 2023-11-25 MED ORDER — VALSARTAN 160 MG PO TABS: 160.0000 mg | ORAL_TABLET | Freq: Every day | ORAL | 1 refills | Status: DC

## 2023-11-25 NOTE — Telephone Encounter (Addendum)
 Brief Telephone Documentation Reason for Call: Patient left message regarding question for pharmacist   Summary of Call: Patient called to reschedule upcoming appointment with pharmacist for next week. She reported some concern for elevated BP readings AFTER medications since last visit. BP was elevated at last office visit on 11/17/23 at 169/59 mmHg.   Average home BP readings: 142/60 mmHg  Patient has been unable to obtain a small BP cuff and there was some concern for higher home BP than seen on her machine d/t cuff size (~10 points higher). Will increase dose of valsartan  today and follow up in clinic in 2 weeks.  Plan: Increase valsartan  to 160 mg daily. Can take 2 tablets daily until next refill Continue amlodipine  5 mg daily Continued metoprolol  succinate 100 mg daily  Sent prescription to DME company for blood pressure monitoring supplies  Follow Up: Patient given direct line for further questions/concerns. Pharmacist: 12/09/23  Peyton CHARLENA Ferries, PharmD Clinical Pharmacist [facility] [phone]

## 2023-11-30 ENCOUNTER — Inpatient Hospital Stay: Admitting: Oncology

## 2023-11-30 ENCOUNTER — Encounter: Payer: Self-pay | Admitting: Oncology

## 2023-11-30 VITALS — BP 185/68 | HR 70 | Temp 96.9°F | Resp 19 | Ht 61.5 in | Wt 120.6 lb

## 2023-11-30 DIAGNOSIS — Z79899 Other long term (current) drug therapy: Secondary | ICD-10-CM | POA: Diagnosis not present

## 2023-11-30 DIAGNOSIS — D471 Chronic myeloproliferative disease: Secondary | ICD-10-CM

## 2023-11-30 DIAGNOSIS — D45 Polycythemia vera: Secondary | ICD-10-CM

## 2023-11-30 MED ORDER — HYDROXYUREA 500 MG PO CAPS: 500.0000 mg | ORAL_CAPSULE | Freq: Every day | ORAL | 2 refills | Status: DC

## 2023-11-30 NOTE — Progress Notes (Signed)
 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  She has been referred for leukocytosis.  CBC from 10/28/2023 showed white cell count of 15, H&H of 16/48.7 and a platelet count of 557.  She had similar counts 2 months ago as well.  Differential mainly shows neutrophilia and eosinophilia.  I do not have any prior counts for comparison.  Patient states that she used to get her blood work at a private clinic in [place] and does not have access to those lab tests.   She has a good appetite and stable weight, although she notes a weight decrease of about two pounds overnight. She experiences mild night sweats, primarily feeling hot under her arms and around her chest.   She has a history of donating stem cells for her brother in [place]. 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 She mentions a past skin cancer incident where she experienced unexpected bleeding despite high platelet counts, which was managed with lidocaine and epinephrine injections.  Results of blood work from 11/17/2023 showed white cell count of 15.6 predominantly with neutrophilia, H&H of 15.9/48.3 and a platelet count of 745.  JAK2 mutation testing positive.  CALR and MPL negative.  BCR-ABL FISH testing negative.  Peripheral flow cytometry was unremarkable.  EPO levels were low  normal at 3.  Interval history-she is doing well presently and denies any specific complaints at this time  ECOG PS- 0 Pain scale- 0   Review of systems- Review of Systems  Constitutional:  Negative for chills, fever, malaise/fatigue and weight loss.  HENT:  Negative for congestion, ear discharge and nosebleeds.   Eyes:  Negative for blurred vision.  Respiratory:  Negative for cough, hemoptysis, sputum production, shortness of breath and wheezing.   Cardiovascular:  Negative for chest pain, palpitations, orthopnea and claudication.  Gastrointestinal:  Negative for abdominal pain, blood in stool, constipation, diarrhea, heartburn, melena, nausea and vomiting.  Genitourinary:  Negative for dysuria, flank pain, frequency, hematuria and urgency.  Musculoskeletal:  Negative for back pain, joint pain and myalgias.  Skin:  Negative for rash.  Neurological:  Negative for dizziness, tingling, focal weakness, seizures, weakness and headaches.  Endo/Heme/Allergies:  Does not bruise/bleed easily.  Psychiatric/Behavioral:  Negative for depression and suicidal ideas. The patient does not have insomnia.       Allergies  Allergen Reactions   Allopurinol     Headache    Erythromycin Other (See Comments)    Other Reaction(s): GI Intolerance     Past Medical History:  Diagnosis Date   Allergy    Anxiety    Cataract    Hypertension    Osteoporosis    Thyroid disease      Past Surgical History:  Procedure Laterality Date   CORNEAL TRANSPLANT Right 2020   EYE SURGERY Bilateral     Social History   Socioeconomic History   Marital status: Single    Spouse name: Not on file   Number of  children: Not on file   Years of education: Not on file   Highest education level: Not on file  Occupational History   Not on file  Tobacco Use   Smoking status: Never   Smokeless tobacco: Never  Vaping Use   Vaping status: Never Used  Substance and Sexual Activity   Alcohol use: Not Currently     Comment: occasional   Drug use: Never   Sexual activity: Not Currently  Other Topics Concern   Not on file  Social History Narrative   Not on file   Social Drivers of Health   Financial Resource Strain: Low Risk  (04/14/2023)   Overall Financial Resource Strain (CARDIA)    Difficulty of Paying Living Expenses: Not hard at all  Food Insecurity: No Food Insecurity (11/17/2023)   Hunger Vital Sign    Worried About Running Out of Food in the Last Year: Never true    Ran Out of Food in the Last Year: Never true  Transportation Needs: No Transportation Needs (11/17/2023)   PRAPARE - Administrator, Civil Service (Medical): No    Lack of Transportation (Non-Medical): No  Physical Activity: Not on file  Stress: No Stress Concern Present (04/14/2023)   [employer] of Occupational Health - Occupational Stress Questionnaire    Feeling of Stress : Only a little  Social Connections: Not on file  Intimate Partner Violence: Not At Risk (11/17/2023)   Humiliation, Afraid, Rape, and Kick questionnaire    Fear of Current or Ex-Partner: No    Emotionally Abused: No    Physically Abused: No    Sexually Abused: No    Family History  Problem Relation Age of Onset   Heart Problems Mother    Lung cancer Father      Current Outpatient Medications:    ALPRAZolam (XANAX) 0.25 MG tablet, Take 0.25 mg by mouth at bedtime as needed for sleep., Disp: , Rfl:    amLODipine  (NORVASC ) 5 MG tablet, Take 1 tablet by mouth once daily, Disp: 90 tablet, Rfl: 1   ascorbic acid (VITAMIN C) 500 MG tablet, Take 500 mg by mouth daily., Disp: , Rfl:    aspirin EC 81 MG tablet, Take 81 mg by mouth daily., Disp: , Rfl:    Cholecalciferol (VITAMIN D -1000 MAX ST) 25 MCG (1000 UT) tablet, Take 1,000 Units by mouth daily., Disp: , Rfl:    hydroxyurea (HYDREA) 500 MG capsule, Take 1 capsule (500 mg total) by mouth daily. May take with food to minimize GI side effects., Disp: 90 capsule, Rfl: 2    levothyroxine  (SYNTHROID ) 100 MCG tablet, Take 1 tablet (100 mcg total) by mouth daily., Disp: 30 tablet, Rfl: 3   metoprolol  succinate (TOPROL -XL) 100 MG 24 hr tablet, Take 1 tablet (100 mg total) by mouth daily., Disp: 60 tablet, Rfl: 1   prednisoLONE acetate (PRED FORTE) 1 % ophthalmic suspension, Place 1 drop into the right eye every 3 (three) days., Disp: , Rfl:    valsartan  (DIOVAN ) 160 MG tablet, Take 1 tablet (160 mg total) by mouth daily., Disp: 30 tablet, Rfl: 1  Physical exam:  Vitals:   11/30/23 1308  BP: (!) 185/68  Pulse: 70  Resp: 19  Temp: (!) 96.9 F (36.1 C)  TempSrc: Tympanic  SpO2: 99%  Weight: 120 lb 9.6 oz (54.7 kg)  Height: 5' 1.5 (1.562 m)   Physical Exam Cardiovascular:     Rate and Rhythm: Normal rate and regular rhythm.  Heart sounds: Normal heart sounds.  Pulmonary:     Effort: Pulmonary effort is normal.     Breath sounds: Normal breath sounds.  Skin:    General: Skin is warm and dry.  Neurological:     Mental Status: She is alert and oriented to person, place, and time.      I have personally reviewed labs listed below:    Latest Ref Rng & Units 09/15/2023    3:44 PM  CMP  Glucose 70 - 99 mg/dL 866   BUN 8 - 27 mg/dL 18   Creatinine 9.42 - 1.00 mg/dL 9.22   Sodium 865 - 855 mmol/L 134   Potassium 3.5 - 5.2 mmol/L 5.0   Chloride 96 - 106 mmol/L 95   CO2 20 - 29 mmol/L 26   Calcium 8.7 - 10.3 mg/dL 9.7       Latest Ref Rng & Units 11/17/2023    2:40 PM  CBC  WBC 4.0 - 10.5 K/uL 15.6   Hemoglobin 12.0 - 15.0 g/dL 84.0   Hematocrit 63.9 - 46.0 % 48.3   Platelets 150 - 400 K/uL 745       Assessment and plan- Patient is a 78 y.o. 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 JAK2 mutation and low EPO levels support diagnosis.  Ideally patient needs a bone marrow biopsy to differentiate between primary myelofibrosis versus polycythemia vera versus essential thrombocytosis Increased thrombotic risk necessitates intervention given that she is more than 78 years of age and has JAK2 mutation.  Patient would like to think about bone marrow biopsy and get back to me - Start hydroxyurea 500 mg daily to manage platelet and hemoglobin levels, reduce thrombotic risk. Discussed risks and benefits, including potential side effects.  Goal is to keep hematocrit less than 45 and platelet count less than 400 - Order repeat CBC in 2 weeks to monitor response to hydroxyurea. - Schedule follow-up in 4 weeks with blood work results.  -Patient is on low-dose aspirin 81 mg which she will continue Visit Diagnosis 1. Myeloproliferative neoplasm (HCC)   2. Polycythemia vera (HCC)   3. High risk medication use      Dr. Annah Skene, MD, MPH [facility] [phone] 11/30/2023 4:06 PM

## 2023-11-30 NOTE — Patient Instructions (Signed)
 VISIT SUMMARY:  Today, we discussed your recent blood test results, which showed elevated blood counts. We reviewed the findings and discussed the next steps for managing your condition.  YOUR PLAN:  -MYELOPROLIFERATIVE NEOPLASM, LIKELY POLYCYTHEMIA VERA WITH THROMBOCYTOSIS, LEUKOCYTOSIS, AND ELEVATED HEMOGLOBIN: This condition involves the overproduction of blood cells in your bone marrow, leading to high levels of platelets, white blood cells, and hemoglobin. We will start you on hydroxyurea 500 mg daily to help manage these levels and reduce the risk of blood clots. We discussed the potential side effects of this medication. Additionally, we will arrange a bone marrow biopsy in 1-2 weeks to confirm the diagnosis. This procedure will be done with moderate sedation and fluoroscopy guidance. We will also repeat your complete blood count (CBC) in 2 weeks to monitor your response to the medication.  INSTRUCTIONS:  Please take hydroxyurea 500 mg daily as prescribed. We will arrange for a bone marrow biopsy in 1-2 weeks, and you will need to come back for a repeat CBC in 2 weeks. Schedule a follow-up appointment in 4 weeks to review your blood work results.

## 2023-11-30 NOTE — Progress Notes (Signed)
 Patient has no new or acute concerns at this time.

## 2023-12-01 ENCOUNTER — Ambulatory Visit: Admitting: Oncology

## 2023-12-01 ENCOUNTER — Telehealth: Payer: Self-pay | Admitting: Family Medicine

## 2023-12-01 DIAGNOSIS — H43813 Vitreous degeneration, bilateral: Secondary | ICD-10-CM | POA: Diagnosis not present

## 2023-12-01 DIAGNOSIS — Z961 Presence of intraocular lens: Secondary | ICD-10-CM | POA: Diagnosis not present

## 2023-12-01 DIAGNOSIS — I10 Essential (primary) hypertension: Secondary | ICD-10-CM

## 2023-12-01 DIAGNOSIS — H401111 Primary open-angle glaucoma, right eye, mild stage: Secondary | ICD-10-CM | POA: Diagnosis not present

## 2023-12-01 DIAGNOSIS — H40002 Preglaucoma, unspecified, left eye: Secondary | ICD-10-CM | POA: Diagnosis not present

## 2023-12-01 MED ORDER — METOPROLOL SUCCINATE ER 100 MG PO TB24: 100.0000 mg | ORAL_TABLET | Freq: Every day | ORAL | 1 refills | Status: AC

## 2023-12-01 NOTE — Telephone Encounter (Signed)
[facility] faxed refill request for the following medications:  metoprolol succinate (TOPROL-XL) 100 MG 24 hr tablet   Please advise.

## 2023-12-02 ENCOUNTER — Ambulatory Visit

## 2023-12-09 ENCOUNTER — Ambulatory Visit (INDEPENDENT_AMBULATORY_CARE_PROVIDER_SITE_OTHER)

## 2023-12-09 VITALS — BP 160/82 | HR 62

## 2023-12-09 DIAGNOSIS — I10 Essential (primary) hypertension: Secondary | ICD-10-CM | POA: Diagnosis not present

## 2023-12-09 MED ORDER — AMLODIPINE BESYLATE 10 MG PO TABS: 10.0000 mg | ORAL_TABLET | Freq: Every day | ORAL | 1 refills | Status: DC

## 2023-12-09 NOTE — Progress Notes (Signed)
   S:     78 y.o. female who presents for hypertension evaluation, education, and management.   PCP: Dr. Sharma  At last visit with clinical pharmacist, patient reported nocturia and inconsistent triamterene/hydrochlorothiazide adherence d/t issues with polyuria. Patient was switched to valsartan  80 mg daily.  Patient contacted clinic in the interim and reported elevated BP readings with an average of 142/60 mmHg, however, she had been unable to obtain a smaller cuff at that time. Valsartan  was increased to 160 mg daily.   Today, she continues to report elevated BP readings at home. Patient forgot to bring log to clinic, however, ranges 120-150 mmHg systolic at home. Of note, patient brought additional older cuff to clinic and this cuff was also ~10-15 mmHg lower than in office reading. Patient denies any issues with current medication therapies.   PMH is significant for osteoporosis, hypothyroidism, HTN, and VitD deficiency.   Today, patient arrives in good spirits and presents without assistance. Denies dizziness, lightheadedness, headache, blurred vision, swelling, chest pain.  Family/Social history:  -Tobacco: denies -Alcohol: occasional  Medication adherence appropriate.  Current antihypertensives include: amlodipine  5 mg daily, Toprol  XL 100 mg daily, valsartan  160 mg daily   Antihypertensives tried in the past include: hydrochlorothiazide/triamterne (polyuria)  Reported home blood pressure readings: reported 120-150 mmHg systolic 161/61 mmHg today on home cuff vs 178/73 mmHg at same time on office supplies  Patient reported dietary habits: Eats 3 meals/day Working to limit sodium. Typically eats a TV dinner  Patient-reported exercise habits: none currently; used to go to the gym before COVID   O:  Last 3 Office BP readings: BP Readings from Last 3 Encounters:  12/09/23 (!) 160/82  11/30/23 (!) 185/68  11/25/23 (!) 147/62    BMET    Component Value  Date/Time   NA 134 09/15/2023 1544   K 5.0 09/15/2023 1544   CL 95 (L) 09/15/2023 1544   CO2 26 09/15/2023 1544   GLUCOSE 133 (H) 09/15/2023 1544   BUN 18 09/15/2023 1544   CREATININE 0.77 09/15/2023 1544   CALCIUM 9.7 09/15/2023 1544    Renal function: CrCl cannot be calculated (Patient's most recent lab result is older than the maximum 21 days allowed.).  Clinical ASCVD: No  The ASCVD Risk score (Arnett DK, et al., 2019) failed to calculate for the following reasons:   Cannot find a previous HDL lab   Cannot find a previous total cholesterol lab  A/P: Hypertension diagnosis currently uncontrolled on current medications. BP goal < 130/80 mmHg. Home BP readings are slightly above goal with a range of 120-150 mmHg, however, older home cuff today was ~10-15 mmHg lower than office reading. Will increase CCB dose at this time.  -Continued valsartan  160 mg daily  -Increased amlodipine  10 mg daily -Continued metoprolol  XL 100 mg daily -Recommended patient to obtain a smaller Omron cuff (adult small) for her original machine again. Continue monitoring BP at home ~1 hour AFTER medications. Counseled on proper use of home BP cuff.  -Patient educated on purpose, proper use, and potential adverse effects of amlodipine   -Counseled on lifestyle modifications for blood pressure control including reduced dietary sodium, increased exercise, adequate sleep.  Written patient instructions provided. Patient verbalized understanding of treatment plan.  Total time in face to face counseling 45 minutes.    Follow-up:  Pharmacist on 01/27/24 PCP clinic visit on 12/25/23  Peyton CHARLENA Ferries, PharmD, CPP Clinical Pharmacist [facility] [phone]

## 2023-12-09 NOTE — Patient Instructions (Signed)
 Thanks for visiting with me today!  Please increase amlodipine  to 10 mg once daily. You can take two 5 mg amlodipine  tablets until you run out.   Let me know if you have any questions!  Tyechia Allmendinger E. Marsh, PharmD, CPP Clinical Pharmacist [facility] [phone]

## 2023-12-09 NOTE — Progress Notes (Signed)
 Kim Cardenas                                          MRN: [id number]   12/09/2023   The [facility] Quality Team Specialist reviewed this patient medical record for the purposes of chart review for care gap closure. The following were reviewed: chart review for care gap closure-controlling blood pressure.    [facility] Quality Team

## 2023-12-11 ENCOUNTER — Ambulatory Visit: Admitting: Oncology

## 2023-12-15 ENCOUNTER — Inpatient Hospital Stay

## 2023-12-16 ENCOUNTER — Telehealth: Payer: Self-pay

## 2023-12-16 ENCOUNTER — Encounter: Payer: Self-pay | Admitting: Emergency Medicine

## 2023-12-16 DIAGNOSIS — D72828 Other elevated white blood cell count: Secondary | ICD-10-CM

## 2023-12-16 NOTE — Telephone Encounter (Signed)
 Copied from CRM [id number]. Topic: Referral - Request for Referral >> Dec 16, 2023  1:48 PM Delon DASEN wrote: Did the patient discuss referral with their provider in the last year? Yes (If No - schedule appointment) (If Yes - send message)  Appointment offered? Yes  Type of order/referral and detailed reason for visit: hematology/oncology  Preference of office, provider, location: [facility]  If referral order, have you been seen by this specialty before? Yes (If Yes, this issue or another issue? When? Where?  Can we respond through MyChart? No

## 2023-12-16 NOTE — Addendum Note (Signed)
 Addended by: SIMMONS-ROBINSON, Marializ Ferrebee L on: 12/16/2023 04:51 PM   Modules accepted: Orders

## 2023-12-16 NOTE — Telephone Encounter (Signed)
 Referral requested for hematology in [facility]

## 2023-12-17 NOTE — Telephone Encounter (Signed)
 Patient advised.

## 2023-12-22 ENCOUNTER — Inpatient Hospital Stay: Attending: Oncology

## 2023-12-22 ENCOUNTER — Telehealth: Payer: Self-pay

## 2023-12-22 ENCOUNTER — Ambulatory Visit: Payer: Self-pay | Admitting: Oncology

## 2023-12-22 DIAGNOSIS — D471 Chronic myeloproliferative disease: Secondary | ICD-10-CM

## 2023-12-22 DIAGNOSIS — D45 Polycythemia vera: Secondary | ICD-10-CM | POA: Diagnosis not present

## 2023-12-22 LAB — CMP (CANCER CENTER ONLY)
ALT: 15 U/L (ref 0–44)
AST: 18 U/L (ref 15–41)
Albumin: 4.1 g/dL (ref 3.5–5.0)
Alkaline Phosphatase: 63 U/L (ref 38–126)
Anion gap: 8 (ref 5–15)
BUN: 26 mg/dL — ABNORMAL HIGH (ref 8–23)
CO2: 26 mmol/L (ref 22–32)
Calcium: 9 mg/dL (ref 8.9–10.3)
Chloride: 99 mmol/L (ref 98–111)
Creatinine: 0.6 mg/dL (ref 0.44–1.00)
GFR, Estimated: >60 mL/min (ref >60)
Glucose, Bld: 99 mg/dL (ref 70–99)
Potassium: 4.3 mmol/L (ref 3.5–5.1)
Sodium: 133 mmol/L — ABNORMAL LOW (ref 135–145)
Total Bilirubin: 1.4 mg/dL — ABNORMAL HIGH (ref 0.0–1.2)
Total Protein: 7.3 g/dL (ref 6.5–8.1)

## 2023-12-22 LAB — CBC WITH DIFFERENTIAL (CANCER CENTER ONLY)
Abs Immature Granulocytes: 0.09 K/uL — ABNORMAL HIGH (ref 0.00–0.07)
Basophils Absolute: 0.2 K/uL — ABNORMAL HIGH (ref 0.0–0.1)
Basophils Relative: 1 %
Eosinophils Absolute: 0.9 K/uL — ABNORMAL HIGH (ref 0.0–0.5)
Eosinophils Relative: 6 %
HCT: 45.6 % (ref 36.0–46.0)
Hemoglobin: 15 g/dL (ref 12.0–15.0)
Immature Granulocytes: 1 %
Lymphocytes Relative: 10 %
Lymphs Abs: 1.4 K/uL (ref 0.7–4.0)
MCH: 31.5 pg (ref 26.0–34.0)
MCHC: 32.9 g/dL (ref 30.0–36.0)
MCV: 95.8 fL (ref 80.0–100.0)
Monocytes Absolute: 0.6 K/uL (ref 0.1–1.0)
Monocytes Relative: 4 %
Neutro Abs: 11.4 K/uL — ABNORMAL HIGH (ref 1.7–7.7)
Neutrophils Relative %: 78 %
Platelet Count: 561 K/uL — ABNORMAL HIGH (ref 150–400)
RBC: 4.76 MIL/uL (ref 3.87–5.11)
RDW: 19.4 % — ABNORMAL HIGH (ref 11.5–15.5)
WBC Count: 14.5 K/uL — ABNORMAL HIGH (ref 4.0–10.5)
nRBC: 0 % (ref 0.0–0.2)

## 2023-12-22 NOTE — Telephone Encounter (Signed)
 LVM for patient stating I have some information to share with her from Dr. Melanee and to please give me a call back at her earliest convenience.    Per Dr. Melanee - -  Please have her take hydrea 1000 mg on Saturday and Sunday and 500 mg on other days.

## 2023-12-23 ENCOUNTER — Ambulatory Visit: Admitting: Emergency Medicine

## 2023-12-23 VITALS — BP 126/60 | HR 58 | Ht 61.5 in | Wt 115.0 lb

## 2023-12-23 DIAGNOSIS — Z0000 Encounter for general adult medical examination without abnormal findings: Secondary | ICD-10-CM

## 2023-12-23 NOTE — Progress Notes (Addendum)
 This visit was performed by a medical professional under my direct supervision. I was immediately available for consultation/collaboration. I have reviewed and agree with the Annual Wellness Visit documentation.  Kim Agent, MD  [facility]  12/30/23       Chief Complaint  Patient presents with   Medicare Wellness     Subjective:   Kim Cardenas is a 78 y.o. female who presents for a Medicare Annual Wellness Visit.  Allergies (verified) Allopurinol and Erythromycin   History: Past Medical History:  Diagnosis Date   Allergy    Anxiety    Cataract    Hypertension    Osteoporosis    Thyroid disease    Past Surgical History:  Procedure Laterality Date   CORNEAL TRANSPLANT Right 2020   EYE SURGERY Bilateral    Family History  Problem Relation Age of Onset   Heart Problems Mother    Lung cancer Father    Social History   Occupational History   Occupation: retired  Tobacco Use   Smoking status: Never   Smokeless tobacco: Never  Vaping Use   Vaping status: Never Used  Substance and Sexual Activity   Alcohol use: Yes    Alcohol/week: 2.0 standard drinks of alcohol    Types: 2 Glasses of wine per week    Comment: 1 glass of wine 2 times a week   Drug use: Never   Sexual activity: Not Currently   Tobacco Counseling Counseling given: Not Answered  SDOH Screenings   Food Insecurity: No Food Insecurity (12/23/2023)  Housing: Low Risk  (12/23/2023)  Transportation Needs: No Transportation Needs (12/23/2023)  Utilities: Not At Risk (12/23/2023)  Alcohol Screen: Low Risk  (04/14/2023)  Depression (PHQ2-9): Low Risk  (12/23/2023)  Recent Concern: Depression (PHQ2-9) - Medium Risk (09/29/2023)  Financial Resource Strain: Low Risk  (04/14/2023)  Physical Activity: Inactive (12/23/2023)  Social Connections: Socially Isolated (12/23/2023)  Stress: No Stress Concern Present (12/23/2023)  Tobacco Use: Low Risk  (12/23/2023)  Health  Literacy: Adequate Health Literacy (12/23/2023)   Depression Screen    12/23/2023    8:31 AM 11/30/2023    1:06 PM 11/17/2023    2:04 PM 11/17/2023    1:59 PM 10/28/2023    3:04 PM 09/29/2023    1:11 PM 04/14/2023   11:34 AM  PHQ 2/9 Scores  PHQ - 2 Score 1 0 0 0 0 1 1  PHQ- 9 Score 2 0  0  0  3  5  6       Data saved with a previous flowsheet row definition      Goals Addressed               This Visit's Progress     Increase physical activity (pt-stated)         Visit info / Clinical Intake: Medicare Wellness Visit Type:: Subsequent Annual Wellness Visit Persons participating in visit:: patient Medicare Wellness Visit Mode:: Telephone If telephone:: video declined Because this visit was a virtual/telehealth visit:: pt reported vitals If Telephone or Video please confirm:: I connected with the patient using audio enabled telemedicine application and verified that I am speaking with the correct person using two identifiers; I discussed the limitations of evaluation and management by telemedicine; The patient expressed understanding and agreed to proceed Patient Location:: home Provider Location:: home Information given by:: patient Interpreter Needed?: No Pre-visit prep was completed: yes AWV questionnaire completed by patient prior to visit?: no Living arrangements:: (!) lives alone Patient's  Overall Health Status Rating: good Typical amount of pain: none Does pain affect daily life?: no Are you currently prescribed opioids?: no  Dietary Habits and Nutritional Risks How many meals a day?: 3 Eats fruit and vegetables daily?: yes Most meals are obtained by: preparing own meals; eating out (50/50) In the last 2 weeks, have you had any of the following?: none Diabetic:: no  Functional Status Activities of Daily Living (to include ambulation/medication): Independent Ambulation: Independent with device- listed below Home Assistive Devices/Equipment:  Eyeglasses Medication Administration: Independent Home Management: Independent Manage your own finances?: yes Primary transportation is: driving Concerns about vision?: no *vision screening is required for WTM* Concerns about hearing?: no  Fall Screening Falls in the past year?: 0 Number of falls in past year: 0 Was there an injury with Fall?: 0 Fall Risk Category Calculator: 0 Patient Fall Risk Level: Low Fall Risk  Fall Risk Patient at Risk for Falls Due to: No Fall Risks Fall risk Follow up: Falls evaluation completed  Home and Transportation Safety: All rugs have non-skid backing?: yes All stairs or steps have railings?: yes Grab bars in the bathtub or shower?: yes Have non-skid surface in bathtub or shower?: yes Good home lighting?: yes Regular seat belt use?: yes Hospital stays in the last year:: no  Cognitive Assessment Difficulty concentrating, remembering, or making decisions? : no Will 6CIT or Mini Cog be Completed: yes What year is it?: 0 points What month is it?: 0 points Give patient an address phrase to remember (5 components): [address] [place] About what time is it?: 0 points Count backwards from 20 to 1: 0 points Say the months of the year in reverse: 0 points Repeat the address phrase from earlier: 0 points 6 CIT Score: 0 points  Advance Directives (For Healthcare) Does Patient Have a Medical Advance Directive?: Yes Does patient want to make changes to medical advance directive?: No - Patient declined Type of Advance Directive: Living will; Healthcare Power of Attorney Copy of Healthcare Power of Attorney in Chart?: No - copy requested Copy of Living Will in Chart?: No - copy requested Would patient like information on creating a medical advance directive?: No - Patient declined  Reviewed/Updated  Reviewed/Updated: Reviewed All (Medical, Surgical, Family, Medications, Allergies, Care Teams, Patient Goals)        Objective:    Today's  Vitals   12/23/23 0815  BP: 126/60  Pulse: (!) 58  Weight: 115 lb (52.2 kg)  Height: 5' 1.5 (1.562 m)   Body mass index is 21.38 kg/m.  Current Medications (verified) Outpatient Encounter Medications as of 12/23/2023  Medication Sig   ALPRAZolam (XANAX) 0.25 MG tablet Take 0.25 mg by mouth at bedtime as needed for sleep.   amLODipine  (NORVASC ) 10 MG tablet Take 1 tablet (10 mg total) by mouth daily.   ascorbic acid (VITAMIN C) 500 MG tablet Take 500 mg by mouth daily.   aspirin EC 81 MG tablet Take 81 mg by mouth daily.   Cholecalciferol (VITAMIN D -1000 MAX ST) 25 MCG (1000 UT) tablet Take 1,000 Units by mouth daily.   hydroxyurea (HYDREA) 500 MG capsule Take 1 capsule (500 mg total) by mouth daily. May take with food to minimize GI side effects.   levothyroxine  (SYNTHROID ) 100 MCG tablet Take 1 tablet (100 mcg total) by mouth daily.   metoprolol  succinate (TOPROL -XL) 100 MG 24 hr tablet Take 1 tablet (100 mg total) by mouth daily.   prednisoLONE acetate (PRED FORTE) 1 % ophthalmic  suspension Place 1 drop into the right eye every 3 (three) days.   UNABLE TO FIND Med Name: Closys mouth rinse daily at bedtime   valsartan  (DIOVAN ) 160 MG tablet Take 1 tablet (160 mg total) by mouth daily.   Zinc 50 MG TABS Take 1 tablet by mouth daily.   No facility-administered encounter medications on file as of 12/23/2023.   Hearing/Vision screen Hearing Screening - Comments:: Denies hearing loss  Vision Screening - Comments:: UTD with Dr. Mittie @ [facility] Immunizations and Health Maintenance Health Maintenance  Topic Date Due   Hepatitis C Screening  Never done   DTaP/Tdap/Td (1 - Tdap) Never done   Pneumococcal Vaccine: 50+ Years (1 of 2 - PCV) Never done   Colonoscopy  Never done   Zoster Vaccines- Shingrix (2 of 2) 06/18/2017   COVID-19 Vaccine (9 - Moderna risk 2025-26 season) 05/03/2024   Medicare Annual Wellness (AWV)  12/22/2024   DEXA SCAN  12/28/2024    Influenza Vaccine  Completed   Meningococcal B Vaccine  Aged Out        Assessment/Plan:  This is a routine wellness examination for [place].  Patient Care Team: Sharma Coyer, MD as PCP - General (Family Medicine) Solum, Therisa HERO, MD as Physician Assistant (Endocrinology) Melanee Annah BROCKS, MD as Consulting Physician (Oncology) Mittie Gaskin, MD as Referring Physician (Ophthalmology) Marea Selinda RAMAN, MD as Referring Physician (Vascular Surgery) Isenstein, Arin L, MD (Dermatology) Janit Thresa HERO, DPM as Consulting Physician (Podiatry)  I have personally reviewed and noted the following in the patient's chart:   Medical and social history Use of alcohol, tobacco or illicit drugs  Current medications and supplements including opioid prescriptions. Functional ability and status Nutritional status Physical activity Advanced directives List of other physicians Hospitalizations, surgeries, and ER visits in previous 12 months Vitals Screenings to include cognitive, depression, and falls Referrals and appointments  No orders of the defined types were placed in this encounter.  In addition, I have reviewed and discussed with patient certain preventive protocols, quality metrics, and best practice recommendations. A written personalized care plan for preventive services as well as general preventive health recommendations were provided to patient.   Vina Ned, CMA   12/23/2023   Return in 1 year (on 12/22/2024).  After Visit Summary: (MyChart) Due to this being a telephonic visit, the after visit summary with patients personalized plan was offered to patient via MyChart   Nurse Notes:  Needs Tdap vaccine (pharmacy) Shingrix and pneumonia vaccines UTD per patient Declined colonoscopy at this time

## 2023-12-23 NOTE — Patient Instructions (Signed)
 [name],  Thank you for taking the time for your Medicare Wellness Visit. I appreciate your continued commitment to your health goals. Please review the care plan we discussed, and feel free to reach out if I can assist you further.  Please note that Annual Wellness Visits do not include a physical exam. Some assessments may be limited, especially if the visit was conducted virtually. If needed, we may recommend an in-person follow-up with your provider.  Ongoing Care Seeing your primary care provider every 3 to 6 months helps us  monitor your health and provide consistent, personalized care.   Referrals If a referral was made during today's visit and you haven't received any updates within two weeks, please contact the referred provider directly to check on the status.  Recommended Screenings:  You may get the tetanus vaccine at your local pharmacy at your convenience.   Health Maintenance  Topic Date Due   Medicare Annual Wellness Visit  Never done   Hepatitis C Screening  Never done   DTaP/Tdap/Td vaccine (1 - Tdap) Never done   Pneumococcal Vaccine for age over 67 (1 of 2 - PCV) Never done   Colon Cancer Screening  Never done   Zoster (Shingles) Vaccine (2 of 2) 06/18/2017   COVID-19 Vaccine (9 - Moderna risk 2025-26 season) 05/03/2024   DEXA scan (bone density measurement)  12/28/2024   Flu Shot  Completed   Meningitis B Vaccine  Aged Out       12/23/2023    8:23 AM  Advanced Directives  Does Patient Have a Medical Advance Directive? Yes  Type of Advance Directive Living will;Healthcare Power of Attorney  Does patient want to make changes to medical advance directive? No - Patient declined  Copy of Healthcare Power of Attorney in Chart? No - copy requested    Vision: Annual vision screenings are recommended for early detection of glaucoma, cataracts, and diabetic retinopathy. These exams can also reveal signs of chronic conditions such as diabetes and high blood  pressure.  Dental: Annual dental screenings help detect early signs of oral cancer, gum disease, and other conditions linked to overall health, including heart disease and diabetes.  Please see the attached documents for additional preventive care recommendations.   Fall Prevention in the Home, Adult Falls can cause injuries and affect people of all ages. There are many simple things that you can do to make your home safe and to help prevent falls. If you need it, ask for help making these changes. What actions can I take to prevent falls? General information Use good lighting in all rooms. Make sure to: Replace any light bulbs that burn out. Turn on lights if it is dark and use night-lights. Keep items that you use often in easy-to-reach places. Lower the shelves around your home if needed. Move furniture so that there are clear paths around it. Do not keep throw rugs or other things on the floor that can make you trip. If any of your floors are uneven, fix them. Add color or contrast paint or tape to clearly mark and help you see: Grab bars or handrails. First and last steps of staircases. Where the edge of each step is. If you use a ladder or stepladder: Make sure that it is fully opened. Do not climb a closed ladder. Make sure the sides of the ladder are locked in place. Have someone hold the ladder while you use it. Know where your pets are as you move through your home.  What can I do in the bathroom?     Keep the floor dry. Clean up any water that is on the floor right away. Remove soap buildup in the bathtub or shower. Buildup makes bathtubs and showers slippery. Use non-skid mats or decals on the floor of the bathtub or shower. Attach bath mats securely with double-sided, non-slip rug tape. If you need to sit down while you are in the shower, use a non-slip stool. Install grab bars by the toilet and in the bathtub and shower. Do not use towel bars as grab bars. What can I  do in the bedroom? Make sure that you have a light by your bed that is easy to reach. Do not use any sheets or blankets on your bed that hang to the floor. Have a firm bench or chair with side arms that you can use for support when you get dressed. What can I do in the kitchen? Clean up any spills right away. If you need to reach something above you, use a sturdy step stool that has a grab bar. Keep electrical cables out of the way. Do not use floor polish or wax that makes floors slippery. What can I do with my stairs? Do not leave anything on the stairs. Make sure that you have a light switch at the top and the bottom of the stairs. Have them installed if you do not have them. Make sure that there are handrails on both sides of the stairs. Fix handrails that are broken or loose. Make sure that handrails are as long as the staircases. Install non-slip stair treads on all stairs in your home if they do not have carpet. Avoid having throw rugs at the top or bottom of stairs, or secure the rugs with carpet tape to prevent them from moving. Choose a carpet design that does not hide the edge of steps on the stairs. Make sure that carpet is firmly attached to the stairs. Fix any carpet that is loose or worn. What can I do on the outside of my home? Use bright outdoor lighting. Repair the edges of walkways and driveways and fix any cracks. Clear paths of anything that can make you trip, such as tools or rocks. Add color or contrast paint or tape to clearly mark and help you see high doorway thresholds. Trim any bushes or trees on the main path into your home. Check that handrails are securely fastened and in good repair. Both sides of all steps should have handrails. Install guardrails along the edges of any raised decks or porches. Have leaves, snow, and ice cleared regularly. Use sand, salt, or ice melt on walkways during winter months if you live where there is ice and snow. In the garage, clean  up any spills right away, including grease or oil spills. What other actions can I take? Review your medicines with your health care provider. Some medicines can make you confused or feel dizzy. This can increase your chance of falling. Wear closed-toe shoes that fit well and support your feet. Wear shoes that have rubber soles and low heels. Use a cane, walker, scooter, or crutches that help you move around if needed. Talk with your provider about other ways that you can decrease your risk of falls. This may include seeing a physical therapist to learn to do exercises to improve movement and strength. Where to find more information Centers for Disease Control and Prevention, STEADI: tonerpromos.no [employer] on Aging: baseringtones.pl National Institute on  Aging: baseringtones.pl Contact a health care provider if: You are afraid of falling at home. You feel weak, drowsy, or dizzy at home. You fall at home. Get help right away if you: Lose consciousness or have trouble moving after a fall. Have a fall that causes a head injury. These symptoms may be an emergency. Get help right away. Call 911. Do not wait to see if the symptoms will go away. Do not drive yourself to the hospital. This information is not intended to replace advice given to you by your health care provider. Make sure you discuss any questions you have with your health care provider. Document Revised: 09/30/2021 Document Reviewed: 09/30/2021 Elsevier Patient Education  2024 [employer].

## 2023-12-25 ENCOUNTER — Encounter: Payer: Self-pay | Admitting: Family Medicine

## 2023-12-25 ENCOUNTER — Ambulatory Visit (INDEPENDENT_AMBULATORY_CARE_PROVIDER_SITE_OTHER): Admitting: Family Medicine

## 2023-12-25 VITALS — BP 142/53 | HR 65 | Ht 61.5 in | Wt 118.1 lb

## 2023-12-25 DIAGNOSIS — R631 Polydipsia: Secondary | ICD-10-CM | POA: Diagnosis not present

## 2023-12-25 DIAGNOSIS — I10 Essential (primary) hypertension: Secondary | ICD-10-CM | POA: Diagnosis not present

## 2023-12-25 DIAGNOSIS — K4090 Unilateral inguinal hernia, without obstruction or gangrene, not specified as recurrent: Secondary | ICD-10-CM | POA: Diagnosis not present

## 2023-12-25 DIAGNOSIS — Z79899 Other long term (current) drug therapy: Secondary | ICD-10-CM | POA: Diagnosis not present

## 2023-12-25 DIAGNOSIS — R61 Generalized hyperhidrosis: Secondary | ICD-10-CM

## 2023-12-25 DIAGNOSIS — R634 Abnormal weight loss: Secondary | ICD-10-CM

## 2023-12-25 DIAGNOSIS — E039 Hypothyroidism, unspecified: Secondary | ICD-10-CM | POA: Diagnosis not present

## 2023-12-25 DIAGNOSIS — R351 Nocturia: Secondary | ICD-10-CM

## 2023-12-25 DIAGNOSIS — F5101 Primary insomnia: Secondary | ICD-10-CM

## 2023-12-25 LAB — POCT URINALYSIS DIPSTICK
Bilirubin, UA: NEGATIVE
Blood, UA: NEGATIVE
Glucose, UA: NEGATIVE
Ketones, UA: NEGATIVE
Leukocytes, UA: NEGATIVE
Nitrite, UA: NEGATIVE
Protein, UA: NEGATIVE
Spec Grav, UA: 1.01 (ref 1.010–1.025)
Urobilinogen, UA: 0.2 U/dL
pH, UA: 5 (ref 5.0–8.0)

## 2023-12-25 MED ORDER — ALPRAZOLAM 0.25 MG PO TABS: 0.2500 mg | ORAL_TABLET | Freq: Every evening | ORAL | 2 refills | Status: AC | PRN

## 2023-12-25 NOTE — Progress Notes (Signed)
 Established patient visit   Patient: Kim Cardenas   DOB: 09-06-1945   78 y.o. Female  MRN: [id number] Visit Date: 12/25/2023  Today's healthcare provider: [name], MD   Chief Complaint  Patient presents with   Medical Management of Chronic Issues   Hypertension    Patient is present for follow up htn with PCP, feeling well overall    Hypothyroidism   Subjective     HPI     Hypertension    Additional comments: Patient is present for follow up htn with PCP, feeling well overall       Last edited by Cherry Chiquita HERO, CMA on 12/25/2023  1:22 PM.       Discussed the use of AI scribe software for clinical note transcription with the patient, who gave verbal consent to proceed.  History of Present Illness Kim Cardenas is a 78 year old female with chronic hypertension who presents for a follow-up visit.  She has been experiencing fluctuating blood pressure readings at home, with a recent measurement of 146/73 mmHg, which is an improvement from a previous reading of 173/73 mmHg. Her blood pressure tends to be lower in the morning and increases throughout the day. She uses a blood pressure monitor that belonged to her mother. She is currently taking valsartan  160 mg daily, amlodipine  5 mg daily, and metoprolol  100 mg daily for her hypertension. She is hesitant to increase her amlodipine  dose to 10 mg due to concerns about low blood pressure, especially at night.  She experiences frequent urination at night, approximately three to four times, and reports losing about three pounds overnight. She also experiences night sweats, which are not severe enough to require changing sheets but cause discomfort.  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 She has noticed a decrease in her platelet count but remains concerned about her elevated white blood cell count. She is awaiting  a second opinion at [facility] scheduled for December 22nd. She reports a sensation of 'needles' on her skin after showering, which she associates with polycythemia vera.  She has a history of hypothyroidism and is currently on levothyroxine  100 mcg daily. She is due for a TSH check to assess the response to her current dose.  She has been using Xanax 0.25 mg at bedtime for sleep for several years and reports it helps her stay asleep. She does not use it during the day.  She has a right inguinal bulge that she suspects might be a hernia, which is more noticeable when standing and not painful. She has noticed this for several months.   117/62 (HR 60)  Past Medical History:  Diagnosis Date   Allergy    Anxiety    Cataract    Hypertension    Osteoporosis    Thyroid disease     Medications: Outpatient Medications Prior to Visit  Medication Sig   amLODipine  (NORVASC ) 10 MG tablet Take 1 tablet (10 mg total) by mouth daily. (Patient taking differently: Take 5 mg by mouth daily.)   ascorbic acid (VITAMIN C) 500 MG tablet Take 500 mg by mouth daily.   aspirin EC 81 MG tablet Take 81 mg by mouth daily.   Cholecalciferol (VITAMIN D -1000 MAX ST) 25 MCG (1000 UT) tablet Take 1,000 Units by mouth daily.   hydroxyurea (HYDREA) 500 MG capsule Take 1 capsule (500 mg total) by  mouth daily. May take with food to minimize GI side effects.   levothyroxine  (SYNTHROID ) 100 MCG tablet Take 1 tablet (100 mcg total) by mouth daily.   metoprolol  succinate (TOPROL -XL) 100 MG 24 hr tablet Take 1 tablet (100 mg total) by mouth daily.   prednisoLONE acetate (PRED FORTE) 1 % ophthalmic suspension Place 1 drop into the right eye every 3 (three) days.   UNABLE TO FIND Med Name: Closys mouth rinse daily at bedtime   valsartan  (DIOVAN ) 160 MG tablet Take 1 tablet (160 mg total) by mouth daily.   Zinc 50 MG TABS Take 1 tablet by mouth daily.   [DISCONTINUED] ALPRAZolam (XANAX) 0.25 MG tablet Take 0.25 mg by mouth at  bedtime as needed for sleep.   No facility-administered medications prior to visit.    Review of Systems  Last metabolic panel Lab Results  Component Value Date   GLUCOSE 99 12/22/2023   NA 133 (L) 12/22/2023   K 4.3 12/22/2023   CL 99 12/22/2023   CO2 26 12/22/2023   BUN 26 (H) 12/22/2023   CREATININE 0.60 12/22/2023   GFRNONAA >60 12/22/2023   CALCIUM 9.0 12/22/2023   PROT 7.3 12/22/2023   ALBUMIN 4.1 12/22/2023   BILITOT 1.4 (H) 12/22/2023   ALKPHOS 63 12/22/2023   AST 18 12/22/2023   ALT 15 12/22/2023   ANIONGAP 8 12/22/2023   Last lipids No results found for: CHOL, HDL, LDLCALC, LDLDIRECT, TRIG, CHOLHDL Last hemoglobin A1c Lab Results  Component Value Date   HGBA1C 5.3 10/28/2023   Lab Results  Component Value Date   TSH 8.540 (H) 10/28/2023         Objective    BP (!) 142/53 (Cuff Size: Normal)   Pulse 65   Ht 5' 1.5 (1.562 m)   Wt 118 lb 1.6 oz (53.6 kg)   SpO2 99%   BMI 21.95 kg/m  BP Readings from Last 3 Encounters:  12/25/23 (!) 142/53  12/23/23 126/60  12/09/23 (!) 160/82   Wt Readings from Last 3 Encounters:  12/25/23 118 lb 1.6 oz (53.6 kg)  12/23/23 115 lb (52.2 kg)  11/30/23 120 lb 9.6 oz (54.7 kg)        Physical Exam  Physical Exam VITALS: BP- 142/54 CHEST: Clear to auscultation bilaterally, no wheezes or crackles, no respiratory distress. CARDIOVASCULAR: Regular rate and rhythm, S1 and S2 normal without murmurs. ABDOMEN: Soft, non-distended, normal bowel sounds. GENITOURINARY: Right inguinal bulge, nontender, no erythema, soft.     No results found for any visits on 12/25/23.  Assessment & Plan     Problem List Items Addressed This Visit     Chronic prescription benzodiazepine use   Hypothyroidism, acquired   Relevant Orders   TSH + free T4   Night sweats   Relevant Orders   DG Chest 2 View   Nocturia more than twice per night   Relevant Orders   CT ABDOMEN PELVIS W CONTRAST   POCT Urinalysis  Dipstick   Primary hypertension - Primary   Primary insomnia   Relevant Medications   ALPRAZolam (XANAX) 0.25 MG tablet   Reducible right inguinal hernia   Other Visit Diagnoses       Polydipsia         Unintentional weight loss       Relevant Orders   CT ABDOMEN PELVIS W CONTRAST       Assessment and Plan Assessment & Plan Essential hypertension, Chronic  Blood pressure readings show variability, with a recent  reading of 146/73 mmHg. Current management includes valsartan  160 mg and metoprolol  100 mg. Amlodipine  was considered for increase to 10 mg but deferred due to concerns about hypotension and nocturia. - Continue valsartan  160 mg daily. - Continue metoprolol  100 mg daily. - Held off on increasing amlodipine  to 10 mg.  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 Current treatment includes hydroxyurea, with a plan to increase the dose to 1000 mg on weekends and 500 mg on weekdays. A second opinion is scheduled at [facility] on December 22nd. Concerns about potential cancerous etiology and the need for a biopsy were discussed. She is hesitant about the biopsy but understands its importance for diagnosis. - Continue hydroxyurea as per hematologist's recommendation: 1000 mg on weekends and 500 mg on weekdays. - Proceed with second opinion appointment at [facility] on December 22nd. - Ordered abdominal and pelvic CT to evaluate for any tumorous growths contributing to symptoms. - Ordered chest x-ray to rule out any growths producing hormones affecting urination.  Hypothyroidism Chronic TSH levels were previously elevated, and the dose of levothyroxine  was increased to 100 mcg. She prefers to have TSH levels checked by the current provider rather than the endocrinologist. - Ordered TSH and T4 levels to assess response to levothyroxine  100 mcg. - Continue levothyroxine  100 mcg  daily.  Unilateral right inguinal hernia Presence of a right inguinal bulge, suspected to be a direct hernia. It is nontender and reducible, with no erythema. - Ordered abdominal and pelvic CT to evaluate the inguinal hernia.  Nocturia and polydipsia Experiencing nocturia with 3-4 episodes per night and significant weight loss overnight. Possible causes include SIADH or other hormonal imbalances. Differential diagnosis includes myeloproliferative disorder contributing to symptoms. - Ordered urine analysis to check for abnormalities. - Ordered chest x-ray to rule out any growths producing hormones affecting urination. - Ordered abdominal and pelvic CT to evaluate for any tumorous growths contributing to symptoms.  Abnormal weight loss Significant weight loss overnight, possibly related to nocturia and polydipsia. Appetite remains good, but there is concern about unintentional weight loss. - Ordered abdominal and pelvic CT to evaluate for any tumorous growths contributing to weight loss.  Generalized hyperhidrosis (night sweats) Experiencing night sweats, which may be related to the suspected myeloproliferative disorder or other underlying conditions. - Ordered chest x-ray to rule out any growths producing hormones affecting sweating. - Ordered abdominal and pelvic CT to evaluate for any tumorous growths contributing to symptoms.  Primary insomnia managed with chronic benzodiazepine therapy Chronic use of Xanax 0.25 mg at bedtime for insomnia. Concerns about long-term use and potential side effects, including memory problems, were discussed. She is aware of the need for a taper if discontinuation is considered. - Continue Xanax 0.25 mg at bedtime.     Return in about 2 months (around 02/24/2024) for BP, hernia inguinal canal, heme f/u, TSH.      Total time spent on today's visit was 50 minutes, including both face-to-face time interviewing and examining the patient, reviewing medical  record including labs/imaging/specialist notes, developing and discussing further evaluation,answering patient's questions, ordering referrals to specialists, ordering imaging coordinating follow up care in addition to documenting in the patient's chart.     [name], MD  [facility] [phone] (phone) [phone] (fax)  [facility]

## 2023-12-25 NOTE — Patient Instructions (Signed)
 Please report to [facility] located at:  [address]  [place], [place] [address]  You do not need an appointment to have xrays completed.   Our office will follow up with  results once available.     To keep you healthy, please keep in mind the following health maintenance items that you are due for:   Health Maintenance Due  Topic Date Due   Hepatitis C Screening  Never done   DTaP/Tdap/Td (1 - Tdap) Never done   Pneumococcal Vaccine: 50+ Years (1 of 2 - PCV) Never done   Colonoscopy  Never done   Zoster Vaccines- Shingrix (2 of 2) 06/18/2017     Best Wishes,   Dr. Lang

## 2023-12-26 LAB — TSH+FREE T4
Free T4: 1.81 ng/dL — ABNORMAL HIGH (ref 0.82–1.77)
TSH: 1.56 u[IU]/mL (ref 0.450–4.500)

## 2023-12-28 ENCOUNTER — Ambulatory Visit: Payer: Self-pay | Admitting: Family Medicine

## 2023-12-28 DIAGNOSIS — D45 Polycythemia vera: Secondary | ICD-10-CM

## 2023-12-28 DIAGNOSIS — D471 Chronic myeloproliferative disease: Secondary | ICD-10-CM

## 2023-12-31 ENCOUNTER — Inpatient Hospital Stay

## 2023-12-31 ENCOUNTER — Inpatient Hospital Stay: Admitting: Oncology

## 2024-01-05 ENCOUNTER — Ambulatory Visit
Admission: RE | Admit: 2024-01-05 | Discharge: 2024-01-05 | Disposition: A | Discharge status: Home / Self Care | Source: Ambulatory Visit | Attending: Family Medicine | Admitting: Family Medicine

## 2024-01-05 DIAGNOSIS — R161 Splenomegaly, not elsewhere classified: Secondary | ICD-10-CM | POA: Diagnosis not present

## 2024-01-05 DIAGNOSIS — R1909 Other intra-abdominal and pelvic swelling, mass and lump: Secondary | ICD-10-CM | POA: Diagnosis not present

## 2024-01-05 DIAGNOSIS — R351 Nocturia: Secondary | ICD-10-CM | POA: Diagnosis not present

## 2024-01-05 DIAGNOSIS — R634 Abnormal weight loss: Secondary | ICD-10-CM | POA: Diagnosis not present

## 2024-01-05 DIAGNOSIS — K4020 Bilateral inguinal hernia, without obstruction or gangrene, not specified as recurrent: Secondary | ICD-10-CM | POA: Diagnosis not present

## 2024-01-05 MED ORDER — IOHEXOL 300 MG/ML  SOLN
65.0000 mL | Freq: Once | INTRAMUSCULAR | Status: AC | PRN
  Administered 2024-01-05: 65 mL via INTRAVENOUS

## 2024-01-05 MED ORDER — IOHEXOL 240 MG/ML SOLN
50.0000 mL | Freq: Once | INTRAMUSCULAR | Status: AC | PRN
  Administered 2024-01-05: 50 mL via ORAL

## 2024-01-15 ENCOUNTER — Other Ambulatory Visit: Payer: Self-pay

## 2024-01-15 DIAGNOSIS — D471 Chronic myeloproliferative disease: Secondary | ICD-10-CM

## 2024-01-18 ENCOUNTER — Inpatient Hospital Stay (HOSPITAL_BASED_OUTPATIENT_CLINIC_OR_DEPARTMENT_OTHER): Admitting: Oncology

## 2024-01-18 ENCOUNTER — Other Ambulatory Visit: Payer: Self-pay

## 2024-01-18 ENCOUNTER — Inpatient Hospital Stay: Attending: Oncology

## 2024-01-18 VITALS — BP 174/77 | HR 62 | Temp 97.0°F | Resp 17 | Ht 61.5 in | Wt 117.4 lb

## 2024-01-18 DIAGNOSIS — Z79899 Other long term (current) drug therapy: Secondary | ICD-10-CM | POA: Diagnosis not present

## 2024-01-18 DIAGNOSIS — D471 Chronic myeloproliferative disease: Secondary | ICD-10-CM | POA: Diagnosis not present

## 2024-01-18 DIAGNOSIS — D45 Polycythemia vera: Secondary | ICD-10-CM

## 2024-01-18 LAB — CBC WITH DIFFERENTIAL (CANCER CENTER ONLY)
Abs Immature Granulocytes: 0.07 K/uL (ref 0.00–0.07)
Basophils Absolute: 0.1 K/uL (ref 0.0–0.1)
Basophils Relative: 1 %
Eosinophils Absolute: 0.7 K/uL — ABNORMAL HIGH (ref 0.0–0.5)
Eosinophils Relative: 5 %
HCT: 47.9 % — ABNORMAL HIGH (ref 36.0–46.0)
Hemoglobin: 15.9 g/dL — ABNORMAL HIGH (ref 12.0–15.0)
Immature Granulocytes: 1 %
Lymphocytes Relative: 10 %
Lymphs Abs: 1.2 K/uL (ref 0.7–4.0)
MCH: 33 pg (ref 26.0–34.0)
MCHC: 33.2 g/dL (ref 30.0–36.0)
MCV: 99.4 fL (ref 80.0–100.0)
Monocytes Absolute: 0.5 K/uL (ref 0.1–1.0)
Monocytes Relative: 4 %
Neutro Abs: 9.6 K/uL — ABNORMAL HIGH (ref 1.7–7.7)
Neutrophils Relative %: 79 %
Platelet Count: 386 K/uL (ref 150–400)
RBC: 4.82 MIL/uL (ref 3.87–5.11)
RDW: 20.9 % — ABNORMAL HIGH (ref 11.5–15.5)
WBC Count: 12.2 K/uL — ABNORMAL HIGH (ref 4.0–10.5)
nRBC: 0 % (ref 0.0–0.2)

## 2024-01-18 LAB — CMP (CANCER CENTER ONLY)
ALT: 10 U/L (ref 0–44)
AST: 20 U/L (ref 15–41)
Albumin: 4.5 g/dL (ref 3.5–5.0)
Alkaline Phosphatase: 79 U/L (ref 38–126)
Anion gap: 10 (ref 5–15)
BUN: 22 mg/dL (ref 8–23)
CO2: 29 mmol/L (ref 22–32)
Calcium: 9.7 mg/dL (ref 8.9–10.3)
Chloride: 99 mmol/L (ref 98–111)
Creatinine: 0.78 mg/dL (ref 0.44–1.00)
GFR, Estimated: >60 mL/min (ref >60)
Glucose, Bld: 105 mg/dL — ABNORMAL HIGH (ref 70–99)
Potassium: 4.5 mmol/L (ref 3.5–5.1)
Sodium: 138 mmol/L (ref 135–145)
Total Bilirubin: 0.9 mg/dL (ref 0.0–1.2)
Total Protein: 7.4 g/dL (ref 6.5–8.1)

## 2024-01-18 NOTE — Progress Notes (Signed)
 Hematology/Oncology Consult note [facility]  [phone] [phone]  Patient Care Team: Sharma Coyer, MD as PCP - General (Family Medicine) Damian, Therisa HERO, MD as Physician Assistant (Endocrinology) Melanee Annah BROCKS, MD as Consulting Physician (Oncology) Mittie Gaskin, MD as Referring Physician (Ophthalmology) Marea Selinda RAMAN, MD as Referring Physician (Vascular Surgery) Isenstein, Arin L, MD (Dermatology) Janit Thresa HERO, DPM as Consulting Physician (Podiatry)   Name of the patient: Kim Cardenas  [id number]  02/22/1945   Date of visit: 01/18/24  Diagnosis-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  She has been referred for leukocytosis.  CBC from 10/28/2023 showed white cell count of 15, H&H of 16/48.7 and a platelet count of 557.  She had similar counts 2 months ago as well.  Differential mainly shows neutrophilia and eosinophilia.  I do not have any prior counts for comparison.  Patient states that she used to get her blood work at a private clinic in [place] and does not have access to those lab tests.   She has a good appetite and stable weight, although she notes a weight decrease of about two pounds overnight. She experiences mild night sweats, primarily feeling hot under her arms and around her chest.   She has a history of donating stem cells for her brother in [place]. 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 She mentions a past skin cancer incident where she experienced unexpected bleeding despite high platelet counts, which was managed with lidocaine and epinephrine injections.    Results of blood work from 11/17/2023 showed white cell count of 15.6 predominantly with neutrophilia, H&H of 15.9/48.3 and a platelet count of 745.  JAK2 mutation testing positive.  CALR and MPL negative.  BCR-ABL FISH testing negative.  Peripheral flow cytometry was unremarkable.  EPO levels were low normal at 3.    Interval history- Discussed the use of AI scribe software for clinical note transcription with the patient, who gave verbal consent to proceed.  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  She is currently managed with hydroxyurea , 500 mg daily except 1000 mg on weekends, and aspirin 81 mg daily. Despite this regimen for the past month, hematocrit remains elevated at 47.9% (goal <45%) and hemoglobin is 15.9 g/dL. Platelet count is stable at 386 and white blood cell count is trending down at 12.2. She denies adverse effects from hydroxyurea  and tolerates the regimen well.  Recent CT imaging demonstrated splenomegaly with a spleen size of 14.8 cm. She reports postprandial abdominal bloating, particularly at night, and associated back pain, which she attributes to splenomegaly.  She continues to experience significant pruritus, especially after showering, and inquired about non-sedating antihistamines for symptomatic relief.  She is engaged in tracking her laboratory values and discussed the possibility of phlebotomy, but agreed to increase hydroxyurea  dosing as recommended.       ECOG PS- 1 Pain scale- 0   Review of systems- Review of Systems  Constitutional:  Negative for chills, fever, malaise/fatigue and weight loss.  HENT:  Negative for congestion, ear discharge and nosebleeds.   Eyes:  Negative for blurred vision.  Respiratory:  Negative for cough, hemoptysis, sputum production, shortness of breath  and wheezing.   Cardiovascular:  Negative for chest pain, palpitations, orthopnea and claudication.  Gastrointestinal:  Negative for abdominal pain, blood in stool, constipation, diarrhea, heartburn, melena, nausea and vomiting.  Genitourinary:  Negative for dysuria, flank pain, frequency, hematuria and urgency.  Musculoskeletal:  Negative for back pain, joint pain and myalgias.  Skin:  Positive for itching. Negative for rash.  Neurological:  Negative for dizziness, tingling, focal weakness, seizures, weakness and headaches.  Endo/Heme/Allergies:  Does not bruise/bleed easily.  Psychiatric/Behavioral:  Negative for depression and suicidal ideas. The patient does not have insomnia.       Allergies  Allergen Reactions   Allopurinol     Headache    Erythromycin Other (See Comments)    Other Reaction(s): GI Intolerance     Past Medical History:  Diagnosis Date   Allergy    Anxiety    Cataract    Hypertension    Osteoporosis    Thyroid  disease      Past Surgical History:  Procedure Laterality Date   CORNEAL TRANSPLANT Right 2020   EYE SURGERY Bilateral     Social History   Socioeconomic History   Marital status: Significant Other    Spouse name: Not on file   Number of children: 0   Years of education: Not on file   Highest education level: Not on file  Occupational History   Occupation: retired  Tobacco Use   Smoking status: Never   Smokeless tobacco: Never  Vaping Use   Vaping status: Never Used  Substance and Sexual Activity   Alcohol use: Yes    Alcohol/week: 2.0 standard drinks of alcohol    Types: 2 Glasses of wine per week    Comment: 1 glass of wine 2 times a week   Drug use: Never   Sexual activity: Not Currently  Other Topics Concern   Not on file  Social History Narrative   Not on file   Social Drivers of Health   Financial Resource Strain: Low Risk  (04/14/2023)   Overall Financial Resource Strain (CARDIA)    Difficulty of Paying Living  Expenses: Not hard at all  Food Insecurity: No Food Insecurity (12/23/2023)   Hunger Vital Sign    Worried About Running Out of Food in the Last Year: Never true    Ran Out of Food in the Last Year: Never true  Transportation Needs: No Transportation Needs (12/23/2023)   PRAPARE - Administrator, Civil Service (Medical): No    Lack of Transportation (Non-Medical): No  Physical Activity: Inactive (12/23/2023)   Exercise Vital Sign    Days of Exercise per Week: 0 days    Minutes of Exercise per Session: 0 min  Stress: No Stress Concern Present (12/23/2023)   [employer] of Occupational Health - Occupational Stress Questionnaire    Feeling of Stress: Only a little  Social Connections: Socially Isolated (12/23/2023)   Social Connection and Isolation Panel    Frequency of Communication with Friends and Family: More than three times a week    Frequency of Social Gatherings with Friends and Family: Twice a week    Attends Religious Services: Never    Database Administrator or Organizations: No    Attends Banker Meetings: Never    Marital Status: Never married  Intimate Partner Violence: Not At Risk (12/23/2023)   Humiliation, Afraid, Rape, and Kick  questionnaire    Fear of Current or Ex-Partner: No    Emotionally Abused: No    Physically Abused: No    Sexually Abused: No    Family History  Problem Relation Age of Onset   Heart Problems Mother    Lung cancer Father      Current Outpatient Medications:    ALPRAZolam  (XANAX ) 0.25 MG tablet, Take 1 tablet (0.25 mg total) by mouth at bedtime as needed for sleep., Disp: 30 tablet, Rfl: 2   amLODipine  (NORVASC ) 10 MG tablet, Take 1 tablet (10 mg total) by mouth daily. (Patient taking differently: Take 5 mg by mouth daily.), Disp: 90 tablet, Rfl: 1   ascorbic acid (VITAMIN C) 500 MG tablet, Take 500 mg by mouth daily., Disp: , Rfl:    aspirin EC 81 MG tablet, Take 81 mg by mouth daily., Disp: , Rfl:     Cholecalciferol (VITAMIN D -1000 MAX ST) 25 MCG (1000 UT) tablet, Take 1,000 Units by mouth daily., Disp: , Rfl:    hydroxyurea  (HYDREA ) 500 MG capsule, Take 1 capsule (500 mg total) by mouth daily. May take with food to minimize GI side effects., Disp: 90 capsule, Rfl: 2   levothyroxine  (SYNTHROID ) 100 MCG tablet, Take 1 tablet (100 mcg total) by mouth daily., Disp: 30 tablet, Rfl: 3   metoprolol  succinate (TOPROL -XL) 100 MG 24 hr tablet, Take 1 tablet (100 mg total) by mouth daily., Disp: 90 tablet, Rfl: 1   prednisoLONE acetate (PRED FORTE) 1 % ophthalmic suspension, Place 1 drop into the right eye every 3 (three) days., Disp: , Rfl:    UNABLE TO FIND, Med Name: Closys mouth rinse daily at bedtime, Disp: , Rfl:    valsartan  (DIOVAN ) 160 MG tablet, Take 1 tablet (160 mg total) by mouth daily., Disp: 30 tablet, Rfl: 1   Zinc 50 MG TABS, Take 1 tablet by mouth daily., Disp: , Rfl:   Physical exam:  Vitals:   01/18/24 1457 01/18/24 1503  BP: (!) 187/76 (!) 174/77  Pulse: 64 62  Resp: 17   Temp: (!) 97 F (36.1 C)   TempSrc: Tympanic   SpO2: 98%   Weight: 117 lb 6.4 oz (53.3 kg)   Height: 5' 1.5 (1.562 m)    Physical Exam Cardiovascular:     Rate and Rhythm: Normal rate and regular rhythm.     Heart sounds: Normal heart sounds.  Pulmonary:     Effort: Pulmonary effort is normal.     Breath sounds: Normal breath sounds.  Skin:    General: Skin is warm and dry.  Neurological:     Mental Status: She is alert and oriented to person, place, and time.      I have personally reviewed labs listed below:    Latest Ref Rng & Units 01/18/2024    2:37 PM  CMP  Glucose 70 - 99 mg/dL 894   BUN 8 - 23 mg/dL 22   Creatinine 9.55 - 1.00 mg/dL 9.21   Sodium 864 - 854 mmol/L 138   Potassium 3.5 - 5.1 mmol/L 4.5   Chloride 98 - 111 mmol/L 99   CO2 22 - 32 mmol/L 29   Calcium 8.9 - 10.3 mg/dL 9.7   Total Protein 6.5 - 8.1 g/dL 7.4   Total Bilirubin 0.0 - 1.2 mg/dL 0.9   Alkaline Phos 38 -  126 U/L 79   AST 15 - 41 U/L 20   ALT 0 - 44 U/L 10  Latest Ref Rng & Units 01/18/2024    2:37 PM  CBC  WBC 4.0 - 10.5 K/uL 12.2   Hemoglobin 12.0 - 15.0 g/dL 84.0   Hematocrit 63.9 - 46.0 % 47.9   Platelets 150 - 400 K/uL 386    I have personally reviewed Radiology images listed below: No images are attached to the encounter.  CT ABDOMEN PELVIS W CONTRAST Result Date: 01/14/2024 EXAM: CT ABDOMEN AND PELVIS WITH CONTRAST 01/05/2024 03:36:52 PM TECHNIQUE: CT of the abdomen and pelvis was performed with the administration of intravenous contrast. Multiplanar reformatted images are provided for review. Automated exposure control, iterative reconstruction, and/or weight-based adjustment of the mA/kV was utilized to reduce the radiation dose to as low as reasonably achievable. CONTRAST: 65 mL/ Omnipaque  300. COMPARISON: None available. CLINICAL HISTORY: Want to evaluate kidney for any adrenal masses, also has swelling in right inguinal canal with current evaluation for myeloproliferative d/o. Right inguinal swelling, unintended weight loss, night sweats, urinary frequency. FINDINGS: LOWER CHEST: Cardiomegaly. Small fat-containing congenital left diaphragmatic hernia. LIVER: The liver is unremarkable. GALLBLADDER AND BILE DUCTS: Gallbladder is unremarkable. No biliary ductal dilatation. SPLEEN: Mild splenomegaly with a spleen measuring 14.8 cm in greatest dimension. No intrasplenic lesions are identified. PANCREAS: No acute abnormality. ADRENAL GLANDS: No acute abnormality. KIDNEYS, URETERS AND BLADDER: No stones in the kidneys or ureters. No hydronephrosis. No perinephric or periureteral stranding. Urinary bladder is unremarkable. GI AND BOWEL: Moderate colonic stool burden without evidence of obstruction. The stomach, small bowel, and large bowel are otherwise unremarkable. The appendix is normal. PERITONEUM AND RETROPERITONEUM: No ascites. No free air. VASCULATURE: Mild aortoiliac atherosclerotic  calcification. No aortic aneurysm. LYMPH NODES: No pathologic adenopathy within the abdomen and pelvis. REPRODUCTIVE ORGANS: No acute abnormality. BONES AND SOFT TISSUES: Osseous structures are age-appropriate. No acute bone abnormality. No lytic or blastic bone lesion. Small right and tiny left fat-containing inguinal hernias are present. IMPRESSION: 1. No acute findings in the abdomen or pelvis. 2. Mild splenomegaly, measuring 14.8 cm, without intrasplenic lesion. 3. Bilateral fat-containing inguinal hernias, right greater than left. 4. Moderate colonic stool burden 5. Cardiomegaly Electronically signed by: Dorethia Molt MD 01/14/2024 04:18 AM EST RP Workstation: [id number]     Assessment and plan- Patient is a 79 y.o. 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 Hematocrit above target increases thrombotic risk. Platelet count normal; mild leukocytosis trending downward. No hydroxyurea  adverse effects. Phlebotomy considered but increased hydroxyurea  preferred for long-term management. Goal hematocrit <45%. - Increased hydroxyurea  dosing to alternate 500 mg and 1000 mg daily. - Ordered repeat blood work in two weeks and one month to monitor response. - Continued low-dose aspirin 81 mg daily. - Scheduled follow-up in two months to review progress and laboratory results.  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 Spleen size not dictating management; focus on hematocrit control. Symptoms of postprandial bloating and back discomfort may relate to splenic enlargement. Improvement anticipated with disease control. - Provided reassurance that splenomegaly is expected to improve with better control of polycythemia vera.  Pruritus secondary to  polycythemia vera Significant pruritus, especially post-shower, consistent with polycythemia vera. Symptom may improve with disease control. - Recommended trial of non-sedating antihistamine (e.g., loratadine) for symptomatic relief. - Advised that pruritus may improve as hematocrit is better controlled with increased hydroxyurea  dosing.  Visit Diagnosis 1. Myeloproliferative neoplasm (HCC)   2. Polycythemia vera (HCC)   3. High risk medication use      Dr. Annah Skene, MD, MPH [facility] [phone] 01/18/2024 3:51 PM

## 2024-01-18 NOTE — Progress Notes (Signed)
 Asking about CT done 11/25 (ordered by Dr. Marcine Essex).  Says she knows spleen is enlarged.  Sometimes constipated.  Feels like she has something on the bottom of toes / ball of foot.  Mentioned she'd like genetic testing done first prior to any bone marrow specimens. When she showers she itches, arms especially, feels like little pins. Is there some kind of soap or location that can help with this.

## 2024-01-19 DIAGNOSIS — D45 Polycythemia vera: Secondary | ICD-10-CM | POA: Insufficient documentation

## 2024-01-19 DIAGNOSIS — D471 Chronic myeloproliferative disease: Secondary | ICD-10-CM | POA: Insufficient documentation

## 2024-01-19 NOTE — Progress Notes (Signed)
 Called patient and was concerned if she would need to be referred to cardiology?  Please advise.

## 2024-01-20 ENCOUNTER — Other Ambulatory Visit: Payer: Self-pay | Admitting: Family Medicine

## 2024-01-20 DIAGNOSIS — Z85828 Personal history of other malignant neoplasm of skin: Secondary | ICD-10-CM | POA: Diagnosis not present

## 2024-01-20 DIAGNOSIS — L720 Epidermal cyst: Secondary | ICD-10-CM | POA: Diagnosis not present

## 2024-01-20 DIAGNOSIS — D1801 Hemangioma of skin and subcutaneous tissue: Secondary | ICD-10-CM | POA: Diagnosis not present

## 2024-01-20 DIAGNOSIS — L820 Inflamed seborrheic keratosis: Secondary | ICD-10-CM | POA: Diagnosis not present

## 2024-01-20 DIAGNOSIS — L708 Other acne: Secondary | ICD-10-CM | POA: Diagnosis not present

## 2024-01-20 DIAGNOSIS — L538 Other specified erythematous conditions: Secondary | ICD-10-CM | POA: Diagnosis not present

## 2024-01-20 DIAGNOSIS — L821 Other seborrheic keratosis: Secondary | ICD-10-CM | POA: Diagnosis not present

## 2024-01-20 DIAGNOSIS — Z08 Encounter for follow-up examination after completed treatment for malignant neoplasm: Secondary | ICD-10-CM | POA: Diagnosis not present

## 2024-01-20 DIAGNOSIS — E039 Hypothyroidism, unspecified: Secondary | ICD-10-CM

## 2024-01-26 NOTE — Telephone Encounter (Signed)
 Not unless she is having symptoms of shortness of breath or noticing swelling in her legs or having chest pain

## 2024-01-27 ENCOUNTER — Ambulatory Visit

## 2024-01-27 DIAGNOSIS — I10 Essential (primary) hypertension: Secondary | ICD-10-CM | POA: Diagnosis not present

## 2024-01-27 NOTE — Progress Notes (Signed)
° °  S:     78 y.o. female who presents for hypertension evaluation, education, and management. PMH is significant for osteoporosis, hypothyroidism, HTN, and VitD deficiency.   PCP: Dr. Sharma  At last visit with clinical pharmacist on 12/09/23, BP was above goal at 160/82 mmHg. Patient was instructed to increase amlodipine  to 10 mg daily.  At follow up with PCP on 12/25/23, patient reported concerns with possible hypotension and had continued to take amlodipine  5 mg daily.   Since last visit, patient found that her smaller cuff fits on her new BP machine.  Today, patient arrives in good spirits and presents without assistance. Denies dizziness, lightheadedness, headache, blurred vision, swelling, chest pain.  Family/Social history:  -Tobacco: denies -Alcohol: occasional  Patient reports adherence to taking all medications as prescribed.   Current antihypertensives include: amlodipine  5 mg daily, Toprol  XL 100 mg daily, valsartan  160 mg daily   Antihypertensives tried in the past include: hydrochlorothiazide/triamterene (polyuria)  Reported home blood pressure readings: 140-150 systolic during the day, will sometimes drop closer to 110-115 mmHg before bed  Patient reported dietary habits: Eats 3 meals/day Working to limit sodium. Typically eats a TV dinner  Patient-reported exercise habits: none currently; used to go to the gym before COVID   O:  Last 3 Office BP readings: BP Readings from Last 3 Encounters:  01/27/24 (!) 160/66  01/18/24 (!) 174/77  12/25/23 (!) 142/53    BMET    Component Value Date/Time   NA 138 01/18/2024 1437   NA 134 09/15/2023 1544   K 4.5 01/18/2024 1437   CL 99 01/18/2024 1437   CO2 29 01/18/2024 1437   GLUCOSE 105 (H) 01/18/2024 1437   BUN 22 01/18/2024 1437   BUN 18 09/15/2023 1544   CREATININE 0.78 01/18/2024 1437   CALCIUM 9.7 01/18/2024 1437   GFRNONAA >60 01/18/2024 1437    Renal function: Estimated Creatinine Clearance:  46.3 mL/min (by C-G formula based on SCr of 0.78 mg/dL).  Clinical ASCVD: No  The ASCVD Risk score (Arnett DK, et al., 2019) failed to calculate for the following reasons:   Cannot find a previous HDL lab   Cannot find a previous total cholesterol lab   * - Cholesterol units were assumed  A/P: Hypertension diagnosis currently uncontrolled on current medications. BP goal < 130/80 mmHg. Home BP readings are above goal with a range of 140-150 mmHg, and will sometimes be closer to 110-115 mmHg before bed. Patient was been able to get a smaller BP cuff since last visit. Discussed option of starting a short-acting medications for AM administration, such as hydralazine, vs increasing the dose of amlodipine  as previously planned. Patient would like to increase amlodipine  given elevated BP has been consistently elevated at home and in office.  -Continued valsartan  160 mg daily  -Increased amlodipine  10 mg daily -Continued metoprolol  XL 100 mg daily -Counseled on proper use of home BP cuff.  -Patient educated on purpose, proper use, and potential adverse effects of amlodipine   -Counseled on lifestyle modifications for blood pressure control including reduced dietary sodium, increased exercise, adequate sleep. -Counseled for patient to contact clinic if she experiences any lightheadedness or dizziness  Written patient instructions provided. Patient verbalized understanding of treatment plan.  Total time in face to face counseling 45 minutes.    Follow-up:  Pharmacist on 03/30/24 PCP clinic visit on 03/03/24  Peyton CHARLENA Ferries, PharmD, CPP Clinical Pharmacist [facility] [phone]

## 2024-02-01 ENCOUNTER — Ambulatory Visit: Payer: Self-pay | Admitting: Oncology

## 2024-02-01 ENCOUNTER — Inpatient Hospital Stay

## 2024-02-01 DIAGNOSIS — D471 Chronic myeloproliferative disease: Secondary | ICD-10-CM

## 2024-02-01 DIAGNOSIS — D45 Polycythemia vera: Secondary | ICD-10-CM | POA: Diagnosis not present

## 2024-02-01 LAB — CBC WITH DIFFERENTIAL (CANCER CENTER ONLY)
Abs Immature Granulocytes: 0.06 K/uL (ref 0.00–0.07)
Basophils Absolute: 0.1 K/uL (ref 0.0–0.1)
Basophils Relative: 1 %
Eosinophils Absolute: 0.6 K/uL — ABNORMAL HIGH (ref 0.0–0.5)
Eosinophils Relative: 6 %
HCT: 48.5 % — ABNORMAL HIGH (ref 36.0–46.0)
Hemoglobin: 16.1 g/dL — ABNORMAL HIGH (ref 12.0–15.0)
Immature Granulocytes: 1 %
Lymphocytes Relative: 9 %
Lymphs Abs: 0.9 K/uL (ref 0.7–4.0)
MCH: 33.5 pg (ref 26.0–34.0)
MCHC: 33.2 g/dL (ref 30.0–36.0)
MCV: 101 fL — ABNORMAL HIGH (ref 80.0–100.0)
Monocytes Absolute: 0.4 K/uL (ref 0.1–1.0)
Monocytes Relative: 4 %
Neutro Abs: 8.1 K/uL — ABNORMAL HIGH (ref 1.7–7.7)
Neutrophils Relative %: 79 %
Platelet Count: 312 K/uL (ref 150–400)
RBC: 4.8 MIL/uL (ref 3.87–5.11)
RDW: 20.9 % — ABNORMAL HIGH (ref 11.5–15.5)
WBC Count: 10.2 K/uL (ref 4.0–10.5)
nRBC: 0 % (ref 0.0–0.2)

## 2024-02-01 LAB — CMP (CANCER CENTER ONLY)
ALT: 10 U/L (ref 0–44)
AST: 21 U/L (ref 15–41)
Albumin: 4.4 g/dL (ref 3.5–5.0)
Alkaline Phosphatase: 73 U/L (ref 38–126)
Anion gap: 9 (ref 5–15)
BUN: 19 mg/dL (ref 8–23)
CO2: 27 mmol/L (ref 22–32)
Calcium: 9.1 mg/dL (ref 8.9–10.3)
Chloride: 100 mmol/L (ref 98–111)
Creatinine: 0.73 mg/dL (ref 0.44–1.00)
GFR, Estimated: >60 mL/min
Glucose, Bld: 94 mg/dL (ref 70–99)
Potassium: 4.2 mmol/L (ref 3.5–5.1)
Sodium: 136 mmol/L (ref 135–145)
Total Bilirubin: 1.2 mg/dL (ref 0.0–1.2)
Total Protein: 7.1 g/dL (ref 6.5–8.1)

## 2024-02-01 NOTE — Telephone Encounter (Signed)
 Patient returned your phone call. She is taking Hydrea  500 mg and alternating with 1000 mg. She last took 500 mgs today and planned today the 1000 mg dosing tom.. patient said she would need a new script if she should need to take the 1000 mg dosing more often.  Patient read back the instructions- teach back process performed with patient with new dosing.  I spoke with Dr. Melanee. Ok  to pend hydrea .

## 2024-02-01 NOTE — Telephone Encounter (Signed)
 Per Dr. Melanee What dose of Hydrea  she taking? Her hematocrit is still not at goal less than 45. She will need to increase the dose of Hydrea  to 1000 mg 5 days a week and 500 mg 2 days a week.  Outbound call; voice message left.

## 2024-02-02 ENCOUNTER — Other Ambulatory Visit: Payer: Self-pay | Admitting: Family Medicine

## 2024-02-02 DIAGNOSIS — I10 Essential (primary) hypertension: Secondary | ICD-10-CM

## 2024-02-02 DIAGNOSIS — E039 Hypothyroidism, unspecified: Secondary | ICD-10-CM

## 2024-02-02 MED ORDER — HYDROXYUREA 500 MG PO CAPS: ORAL_CAPSULE | ORAL | 2 refills | Status: AC

## 2024-02-08 ENCOUNTER — Telehealth: Payer: Self-pay

## 2024-02-08 NOTE — Telephone Encounter (Cosign Needed Addendum)
 Brief Telephone Documentation Reason for Call: Patient left message regarding question for pharmacist   Summary of Call: Patient reported starting amlodipine  10 mg daily on 01/28/24 and had one home BP of 119/62 mmHg while on therapy.   Last week, she had an episode of dizziness in the evening last week with subsequent BP of 89/47 mmHg around noon. She since decreased her amlodipine  to 5 mg daily and reported home BP readings of 134/70, 143/71, 121/71 mmHg. She reports her BP tends to be lower midday and elevated in the evening.   Counseled for patient to bring her new BP cuff to clinic and continue amlodipine  5 mg daily. Counseled she could trial taking valsartan  in the evening to see if this will help with elevated readings.  Follow Up: Patient given direct line for further questions/concerns.  Jacki Couse E. Marsh, PharmD Clinical Pharmacist [facility] [phone]

## 2024-02-12 ENCOUNTER — Other Ambulatory Visit: Payer: Self-pay

## 2024-02-12 DIAGNOSIS — D471 Chronic myeloproliferative disease: Secondary | ICD-10-CM

## 2024-02-15 ENCOUNTER — Ambulatory Visit: Payer: Self-pay | Admitting: Oncology

## 2024-02-15 ENCOUNTER — Inpatient Hospital Stay: Attending: Oncology

## 2024-02-15 DIAGNOSIS — D471 Chronic myeloproliferative disease: Secondary | ICD-10-CM

## 2024-02-15 LAB — CBC WITH DIFFERENTIAL (CANCER CENTER ONLY)
Abs Immature Granulocytes: 0.07 K/uL (ref 0.00–0.07)
Basophils Absolute: 0.1 K/uL (ref 0.0–0.1)
Basophils Relative: 1 %
Eosinophils Absolute: 0.4 K/uL (ref 0.0–0.5)
Eosinophils Relative: 5 %
HCT: 47.7 % — ABNORMAL HIGH (ref 36.0–46.0)
Hemoglobin: 15.7 g/dL — ABNORMAL HIGH (ref 12.0–15.0)
Immature Granulocytes: 1 %
Lymphocytes Relative: 13 %
Lymphs Abs: 1 K/uL (ref 0.7–4.0)
MCH: 34.3 pg — ABNORMAL HIGH (ref 26.0–34.0)
MCHC: 32.9 g/dL (ref 30.0–36.0)
MCV: 104.1 fL — ABNORMAL HIGH (ref 80.0–100.0)
Monocytes Absolute: 0.4 K/uL (ref 0.1–1.0)
Monocytes Relative: 6 %
Neutro Abs: 5.8 K/uL (ref 1.7–7.7)
Neutrophils Relative %: 74 %
Platelet Count: 261 K/uL (ref 150–400)
RBC: 4.58 MIL/uL (ref 3.87–5.11)
RDW: 20.3 % — ABNORMAL HIGH (ref 11.5–15.5)
WBC Count: 7.7 K/uL (ref 4.0–10.5)
nRBC: 0 % (ref 0.0–0.2)

## 2024-02-15 LAB — CMP (CANCER CENTER ONLY)
ALT: 10 U/L (ref 0–44)
AST: 21 U/L (ref 15–41)
Albumin: 4.5 g/dL (ref 3.5–5.0)
Alkaline Phosphatase: 79 U/L (ref 38–126)
Anion gap: 8 (ref 5–15)
BUN: 19 mg/dL (ref 8–23)
CO2: 29 mmol/L (ref 22–32)
Calcium: 9.3 mg/dL (ref 8.9–10.3)
Chloride: 97 mmol/L — ABNORMAL LOW (ref 98–111)
Creatinine: 0.64 mg/dL (ref 0.44–1.00)
GFR, Estimated: >60 mL/min
Glucose, Bld: 82 mg/dL (ref 70–99)
Potassium: 4.1 mmol/L (ref 3.5–5.1)
Sodium: 134 mmol/L — ABNORMAL LOW (ref 135–145)
Total Bilirubin: 1.2 mg/dL (ref 0.0–1.2)
Total Protein: 7.3 g/dL (ref 6.5–8.1)

## 2024-02-15 NOTE — Telephone Encounter (Signed)
-----   Message from Annah Skene, MD sent at 02/15/2024 11:40 AM EST ----- Goal hematocrit should be less than 45 when she is still 47.7.  she will need to increase hydrea  to 1000 mg daily on all 7 days

## 2024-02-15 NOTE — Telephone Encounter (Signed)
 Per Dr. Melanee Goal hematocrit should be less than 45 when she is still 47.7. she will need to increase hydrea  to 1000 mg daily on all 7 days.  Outbound call to patient; unable to leave voice message (voicemail box is full).

## 2024-02-16 NOTE — Telephone Encounter (Signed)
 Voicemail received from patient yesterday 02/15/24 at 3:53pm returning a nurse call; best call back number is [phone].  Outbound call to patient; informed of below.  Reviewed upcoming appointments in February.

## 2024-02-17 NOTE — Progress Notes (Signed)
 Kim Cardenas                                          MRN: [id number]   02/17/2024   The [facility] Quality Team Specialist reviewed this patient medical record for the purposes of chart review for care gap closure. The following were reviewed: chart review for care gap closure-controlling blood pressure.    [facility] Quality Team

## 2024-02-18 ENCOUNTER — Telehealth: Payer: Self-pay | Admitting: *Deleted

## 2024-02-18 DIAGNOSIS — D45 Polycythemia vera: Secondary | ICD-10-CM

## 2024-02-18 DIAGNOSIS — D471 Chronic myeloproliferative disease: Secondary | ICD-10-CM

## 2024-02-18 NOTE — Telephone Encounter (Signed)
 "    NURSING SYMPTOM TRIAGE ASSESSMENT & DISPOSITION  Mode of interaction:  Walk-in Date of symptom triage interaction:  02/18/2024 Time of symptom triage interaction:  1600  Cancer diagnosis:  Myeloproliferative neoplasm (HCC)  Polycythemia vera (HCC)   Current dose of Hydrea  1000 mg dosing daily x 7 days; last taken today.   Symptom Triage Protocol:  Rash  History of the problem:  Rash Onset of rash symptoms: Today, 02/18/2024  Duration of rash symptoms: Acute rash  Initial location of rash: Anterior crease/prominent skin fold of her neck-the rash is localized.  Areas to where rash has spread: no  Conjunctival involvement: No  Color of rash: red  Texture of rash: Flat  Change in character of rash with time: No  Associated Symptoms: Burning   Aggravating factors: Her retinol cream felt like it was burning her neck and lip area. She has used this for several years.    Alleviating factors or treatments tried: none  Recent Travel: No  Exposures: Contact with others:  No; Medication changes:  Yes: increase of hydrea  from 1,000 mg daily x 5 days to 7 days a week; Skin product changes:  No; but notes burning with retinol creams. I have used this cream for years. When I applied the cream this morning, but burning sensation would not stop. I just want to be reassured that the Hydrea  is not the cause.    Precipitating factors not mentioned:   Changes in ADLs: none     Additional commentary: Patient walked into the cancer center at 1600 c/o rash on her neck. Pt concerned that the hydrea  may be causing her rash.  Patient in no acute distress. No swelling in her face, throat, no shortness of breath, wheezing, chest pain, fevers or body aches.      Triage Nurse Guidance Signs and Symptoms Action  Acute skin changes and associated systemic symptoms, such as swelling of throat, stridor, wheezing, dyspnea, chest pain, severe headache, eye involvement, desquamation, high fever, or  mottled skin below the waist Seek emergency care. Call an ambulance immediately for acute respiratory symptoms.  Infection: drainage from lesion Uncontrolled pruritus History of new drug (suspected drug-induced rash in absence of respiratory symptoms) Systemic symptoms associated with infections or viral syndrome, such as fever, myalgias, or arthralgias Seek urgent care within 24 hours.  Rash related to chemotherapy, biologic therapy, or targeted therapy agents Hand-foot syndrome Papulopustular rash (often on the face and chest) Mild pruritus Mild pain or discomfort from skin alteration Non progressive symptoms Follow homecare instruc-tions. Notify MD if no improvement or if condition worsens     Provider Consulted  Provider name and credentials: Spoke with Morna, NP; Tinnie Dasie PIETY  Provider instruction: [facility] apt tom.     Patient Instruction  Disclaimer:  Patient specific and [employer] instruction provided verbally and sent through MyChart for active users.    Report changes in itching or rash to healthcare provider. Report presence of drainage from skin lesions. Apply cool compresses to inflamed area. Apply topical medication as prescribed. Take oral medication as prescribed and notify healthcare provider of side effects. Expect drowsiness from antihistamines and take safety precautions. Wear loose-fitting cotton clothing. Keep fingernails cut short and wear soft mittens at night to avoid scratching. Avoid hot baths and showers. Avoid sunlight and use sunscreen and sun protection. Hand-foot syndrome: Refer to Hand-Foot Syndrome protocol. Follow precautions for EGFR acneform rash: Moisturize with fragrance-free cream. Apply topical steroid or antibiotic cream as ordered by clinician. Take oral  antibiotics as prescribed. If indicated, avoid extreme temperatures and direct sunlight. Keep nails clean and trimmed while on therapy to avoid paronychia  If prescribed afatinib or  erlotinib, take on an empty stomach, as they interact with food and may result in an increased risk of side effects.  Report the Following Problems: Rash progression No improvement over the next three days Fever that persists for 24 hours Increasing pain or uncontrolled pruritus  Seek Emergency Care Immediately if Any of the Following Occurs: Severe headache Difficulty breathing and throat tightening Chest pain High fever Eye involvement  Teach Back Method used:  Yes    Nurse Triage Priority:  Urgent (obtain medical orders as indicated with instruction for 8-24 hour or sooner follow-up)   Barriers to Care:  None identified  Nurse Triage Disposition:  In-person follow-up visit:  [facility] provider will evaluate rash in person tomorrow. Patient unable to return until 130pm tom. Due to apts with another md apt. Reviewed triage guidance instructions to patient. She understands ED triggers. She will let us  know in the morning if her rash is worse or starts spreading. At this time the rash is localized.  Protocol Source:  Ruthine HERO., & Eldonna CANDIE Pee.). (2019). Telephone triage for oncology nurses (3rd ed.). Oncology Nursing Society.  "

## 2024-02-19 ENCOUNTER — Inpatient Hospital Stay: Admitting: Nurse Practitioner

## 2024-02-19 ENCOUNTER — Telehealth: Payer: Self-pay | Admitting: *Deleted

## 2024-02-19 NOTE — Telephone Encounter (Signed)
 Caller verified using pt's full name and dob prior to discussing PHI    Patient called. Her rash has improved and practically gone. Patient would like to cnl the [facility] apt. She understands that if the rash reoccurs or if she changes her mind to let us  know.  Apts have been cnl for today.

## 2024-03-03 ENCOUNTER — Ambulatory Visit: Admitting: Family Medicine

## 2024-03-03 ENCOUNTER — Encounter: Payer: Self-pay | Admitting: Family Medicine

## 2024-03-03 VITALS — BP 130/58 | HR 58 | Ht 61.5 in | Wt 121.3 lb

## 2024-03-03 DIAGNOSIS — F5101 Primary insomnia: Secondary | ICD-10-CM | POA: Diagnosis not present

## 2024-03-03 DIAGNOSIS — E039 Hypothyroidism, unspecified: Secondary | ICD-10-CM

## 2024-03-03 DIAGNOSIS — I10 Essential (primary) hypertension: Secondary | ICD-10-CM

## 2024-03-03 DIAGNOSIS — R351 Nocturia: Secondary | ICD-10-CM | POA: Diagnosis not present

## 2024-03-03 DIAGNOSIS — Z1211 Encounter for screening for malignant neoplasm of colon: Secondary | ICD-10-CM | POA: Diagnosis not present

## 2024-03-03 DIAGNOSIS — M810 Age-related osteoporosis without current pathological fracture: Secondary | ICD-10-CM

## 2024-03-03 DIAGNOSIS — D45 Polycythemia vera: Secondary | ICD-10-CM | POA: Diagnosis not present

## 2024-03-03 DIAGNOSIS — E782 Mixed hyperlipidemia: Secondary | ICD-10-CM | POA: Diagnosis not present

## 2024-03-03 DIAGNOSIS — E559 Vitamin D deficiency, unspecified: Secondary | ICD-10-CM

## 2024-03-03 MED ORDER — OXYBUTYNIN CHLORIDE 5 MG PO TABS: 5.0000 mg | ORAL_TABLET | Freq: Every day | ORAL | 1 refills | Status: AC

## 2024-03-03 MED ORDER — VALSARTAN 160 MG PO TABS: 160.0000 mg | ORAL_TABLET | Freq: Every day | ORAL | 2 refills | Status: AC

## 2024-03-03 NOTE — Progress Notes (Signed)
 "  Established Patient Office Visit  Patient ID: Kim Cardenas, female    DOB: 1945-07-19  Age: 79 y.o. MRN: [id number] PCP: Sharma Coyer, MD  Chief Complaint  Patient presents with   Medical Management of Chronic Issues    Patient is present for BP, hernia inguinal canal, heme f/u and TSH. Last seen in office on 12/25/23   Hypertension   Hypothyroidism    Subjective:     HPI  Discussed the use of AI scribe software for clinical note transcription with the patient, who gave verbal consent to proceed.  History of Present Illness Kim Cardenas is a 79 year old female with chronic hypertension, chronic hypothyroidism, and polycythemia vera who presents for follow-up.  She experiences variable blood pressure readings at home, with a recent high of 168/52 and a low of 137/67. She finds it challenging to remember to take her valsartan  at a consistent time. Her current antihypertensive regimen includes amlodipine  5 mg daily, metoprolol  100 mcg daily, and valsartan  960 mcg daily.  For polycythemia vera, she is on hydroxyurea , which was increased to 1000 mg five days a week and 500 mg two days a week. Her hemoglobin is 15.7, and her hematocrit is above the value her hematologist is aiming for. She is concerned about the long-term use of hydroxyurea  and its potential side effects, although she is not currently experiencing any. She also mentions an enlarged spleen and describes discomfort after eating as a tightness around her bra area.  She manages her chronic hypothyroidism with Synthroid  100 mcg daily. She also takes aspirin 81 mg daily and Xanax  0.25 mg as needed for insomnia, approximately 8/3 of the time.  She reports frequent urination, especially at night, getting up three to four times, and sometimes experiencing incontinence during the day. She has a history of osteoporosis and occasionally experiences pain across her shoulder blade and side, for which she inquires  about the use of NSAIDs.  She is seeking a referral to a gastroenterologist at [facility] for a colonoscopy, preferring this location over her current appointment at [facility]. She also requests a 90-day supply of valsartan  to reduce pharmacy visits.     ROS    Objective:     BP (!) 130/58 (Cuff Size: Normal)   Pulse (!) 58   Ht 5' 1.5 (1.562 m)   Wt 121 lb 4.8 oz (55 kg)   SpO2 100%   BMI 22.55 kg/m  BP Readings from Last 3 Encounters:  03/03/24 (!) 130/58  01/27/24 (!) 160/66  01/18/24 (!) 174/77   Wt Readings from Last 3 Encounters:  03/03/24 121 lb 4.8 oz (55 kg)  01/18/24 117 lb 6.4 oz (53.3 kg)  12/25/23 118 lb 1.6 oz (53.6 kg)      Physical Exam Vitals reviewed.  Constitutional:      General: She is not in acute distress.    Appearance: Normal appearance. She is not ill-appearing.  Cardiovascular:     Rate and Rhythm: Normal rate and regular rhythm.  Pulmonary:     Effort: Pulmonary effort is normal. No respiratory distress.     Breath sounds: No wheezing, rhonchi or rales.  Neurological:     Mental Status: She is alert and oriented to person, place, and time.  Psychiatric:        Mood and Affect: Mood normal.        Behavior: Behavior normal.      Results for orders placed or performed in visit on 03/03/24  CMP14+EGFR  Result Value Ref Range   Glucose 88 70 - 99 mg/dL   BUN 21 8 - 27 mg/dL   Creatinine, Ser 9.29 0.57 - 1.00 mg/dL   eGFR 90 >40 fO/fpw/8.26   BUN/Creatinine Ratio 30 (H) 12 - 28   Sodium 140 134 - 144 mmol/L   Potassium 4.6 3.5 - 5.2 mmol/L   Chloride 98 96 - 106 mmol/L   CO2 24 20 - 29 mmol/L   Calcium 9.5 8.7 - 10.3 mg/dL   Total Protein 7.1 6.0 - 8.5 g/dL   Albumin 4.7 3.8 - 4.8 g/dL   Globulin, Total 2.4 1.5 - 4.5 g/dL   Bilirubin Total 1.2 0.0 - 1.2 mg/dL   Alkaline Phosphatase 91 49 - 135 IU/L   AST 19 0 - 40 IU/L   ALT 13 0 - 32 IU/L  TSH + free T4  Result Value Ref Range   TSH 0.353 (L) 0.450 - 4.500 uIU/mL    Free T4 1.69 0.82 - 1.77 ng/dL  Lipid panel  Result Value Ref Range   Cholesterol, Total 189 100 - 199 mg/dL   Triglycerides 80 0 - 149 mg/dL   HDL 65 >60 mg/dL   VLDL Cholesterol Cal 15 5 - 40 mg/dL   LDL Chol Calc (NIH) 890 (H) 0 - 99 mg/dL   Chol/HDL Ratio 2.9 0.0 - 4.4 ratio    Lab Results  Component Value Date   WBC 7.7 02/15/2024   HGB 15.7 (H) 02/15/2024   HCT 47.7 (H) 02/15/2024   MCV 104.1 (H) 02/15/2024   PLT 261 02/15/2024    Last metabolic panel Lab Results  Component Value Date   GLUCOSE 88 03/03/2024   NA 140 03/03/2024   K 4.6 03/03/2024   CL 98 03/03/2024   CO2 24 03/03/2024   BUN 21 03/03/2024   CREATININE 0.70 03/03/2024   GFRNONAA >60 02/15/2024   CALCIUM 9.5 03/03/2024   PROT 7.1 03/03/2024   ALBUMIN 4.7 03/03/2024   LABGLOB 2.4 03/03/2024   BILITOT 1.2 03/03/2024   ALKPHOS 91 03/03/2024   AST 19 03/03/2024   ALT 13 03/03/2024   ANIONGAP 8 02/15/2024   Last lipids Lab Results  Component Value Date   CHOL 189 03/03/2024   HDL 65 03/03/2024   LDLCALC 109 (H) 03/03/2024   TRIG 80 03/03/2024   CHOLHDL 2.9 03/03/2024   Last hemoglobin A1c Lab Results  Component Value Date   HGBA1C 5.3 10/28/2023   Last thyroid  functions Lab Results  Component Value Date   TSH 0.353 (L) 03/03/2024   FREET4 1.69 03/03/2024   Last vitamin D  Lab Results  Component Value Date   VD25OH 75.7 10/28/2023   Last vitamin B12 and Folate Lab Results  Component Value Date   VITAMINB12 442 10/28/2023    The 10-year ASCVD risk score (Arnett DK, et al., 2019) is: 24%  Outpatient Encounter Medications as of 03/03/2024  Medication Sig   ALPRAZolam  (XANAX ) 0.25 MG tablet Take 1 tablet (0.25 mg total) by mouth at bedtime as needed for sleep.   amLODipine  (NORVASC ) 5 MG tablet Take 5 mg by mouth daily.   ascorbic acid (VITAMIN C) 500 MG tablet Take 500 mg by mouth daily.   aspirin EC 81 MG tablet Take 81 mg by mouth daily.   Cholecalciferol (VITAMIN D -1000 MAX  ST) 25 MCG (1000 UT) tablet Take 1,000 Units by mouth daily.   hydroxyurea  (HYDREA ) 500 MG capsule Tale 1000 mg 5 days a week and  500 mg 2 days a week. May take with food to minimize GI side effects.   levothyroxine  (SYNTHROID ) 100 MCG tablet Take 1 tablet by mouth once daily   metoprolol  succinate (TOPROL -XL) 100 MG 24 hr tablet Take 1 tablet (100 mg total) by mouth daily.   oxybutynin  (DITROPAN ) 5 MG tablet Take 1 tablet (5 mg total) by mouth daily.   prednisoLONE acetate (PRED FORTE) 1 % ophthalmic suspension Place 1 drop into the right eye every 3 (three) days.   UNABLE TO FIND Med Name: Closys mouth rinse daily at bedtime   Zinc 50 MG TABS Take 1 tablet by mouth daily.   [DISCONTINUED] valsartan  (DIOVAN ) 160 MG tablet Take 1 tablet by mouth once daily   valsartan  (DIOVAN ) 160 MG tablet Take 1 tablet (160 mg total) by mouth daily.   [DISCONTINUED] amLODipine  (NORVASC ) 10 MG tablet Take 1 tablet (10 mg total) by mouth daily. (Patient not taking: Reported on 03/03/2024)   No facility-administered encounter medications on file as of 03/03/2024.       Assessment & Plan:   Problem List Items Addressed This Visit     Hypothyroidism, acquired   Relevant Orders   TSH + free T4 (Completed)   Nocturia more than twice per night   Relevant Medications   oxybutynin  (DITROPAN ) 5 MG tablet   Osteoporosis, post-menopausal   Polycythemia vera (HCC)   Primary hypertension - Primary   Relevant Medications   amLODipine  (NORVASC ) 5 MG tablet   valsartan  (DIOVAN ) 160 MG tablet   Other Relevant Orders   CMP14+EGFR (Completed)   Primary insomnia   Vitamin D  insufficiency   Other Visit Diagnoses       Encounter for screening colonoscopy       Relevant Orders   Ambulatory referral to Gastroenterology     Mixed hyperlipidemia       Relevant Medications   amLODipine  (NORVASC ) 5 MG tablet   valsartan  (DIOVAN ) 160 MG tablet   Other Relevant Orders   Lipid panel (Completed)       Assessment  and Plan Assessment & Plan Primary hypertension Chronic  Blood pressure readings at home are generally well-controlled, with occasional higher readings. Current medications include amlodipine , metoprolol , and valsartan . Valsartan  is an ARB that helps with blood pressure control and protects the heart and kidneys. Discussed the importance of consistent medication timing and proper arm positioning during measurement for accurate readings. - Continue amlodipine  5 mg daily - Continue metoprolol  100 mg daily - Continue valsartan  960 mcg daily - Submitted prescription for 90-day supply of valsartan  with two refills  Polycythemia vera Chronic  Managed with hydroxyurea . Hemoglobin and hematocrit levels are slightly above target, increasing the risk of clots. Discussed the balance of maintaining hemoglobin levels to avoid both clotting and bleeding risks. She is concerned about long-term side effects of hydroxyurea  and is advised to discuss further with her hematologist. Reports occasional bloating, possibly related to spleen enlargement, but not localized to the left side. - Continue hydroxyurea  1000 mg daily - Follow up with hematology on February 2nd - Consider Gas-X or peppermint tea for bloating  Hypothyroidism Chronic  Managed with Synthroid . - Continue Synthroid  100 mcg daily - Checked TSH and T4 levels  Nocturia Chronic  Experiencing frequent nighttime urination, impacting quality of life. Previous use of diuretics may have contributed. Discussed potential use of medication for overactive bladder to manage symptoms. - Prescribed medication for overactive bladder, 5 mg once daily  Primary insomnia Chronic  Occasional use of  Xanax  for insomnia. - Continue Xanax  0.25 mg as needed for insomnia  Osteoporosis, postmenopausal Chronic  Occasional pain across the shoulder blade and side, possibly related to osteoporosis. Discussed the use of NSAIDs for pain management, with consideration of  kidney function. - Use NSAIDs like Aleve as needed for pain  Mixed hyperlipidemia Chronic  Has not had cholesterol checked recently. Previous levels indicate high LDL. - Checked cholesterol panel  Colorectal cancer screening Prefers to have a colonoscopy with a specific gastroenterologist at [facility]. - Referred to [place] gastroenterologist for colonoscopy    Return in about 3 months (around 06/01/2024) for Chronic F/U, HTN.    Kim Agent, MD [facility]  "

## 2024-03-03 NOTE — Patient Instructions (Signed)
 To keep you healthy, please keep in mind the following health maintenance items that you are due for:   Health Maintenance Due  Topic Date Due   Hepatitis C Screening  Never done   DTaP/Tdap/Td (1 - Tdap) Never done   Pneumococcal Vaccine: 50+ Years (1 of 2 - PCV) Never done   Zoster Vaccines- Shingrix (2 of 2) 06/18/2017     Best Wishes,   Dr. Lang

## 2024-03-04 LAB — CMP14+EGFR
ALT: 13 IU/L (ref 0–32)
AST: 19 IU/L (ref 0–40)
Albumin: 4.7 g/dL (ref 3.8–4.8)
Alkaline Phosphatase: 91 IU/L (ref 49–135)
BUN/Creatinine Ratio: 30 — ABNORMAL HIGH (ref 12–28)
BUN: 21 mg/dL (ref 8–27)
Bilirubin Total: 1.2 mg/dL (ref 0.0–1.2)
CO2: 24 mmol/L (ref 20–29)
Calcium: 9.5 mg/dL (ref 8.7–10.3)
Chloride: 98 mmol/L (ref 96–106)
Creatinine, Ser: 0.7 mg/dL (ref 0.57–1.00)
Globulin, Total: 2.4 g/dL (ref 1.5–4.5)
Glucose: 88 mg/dL (ref 70–99)
Potassium: 4.6 mmol/L (ref 3.5–5.2)
Sodium: 140 mmol/L (ref 134–144)
Total Protein: 7.1 g/dL (ref 6.0–8.5)
eGFR: 90 mL/min/1.73

## 2024-03-04 LAB — TSH+FREE T4
Free T4: 1.69 ng/dL (ref 0.82–1.77)
TSH: 0.353 u[IU]/mL — ABNORMAL LOW (ref 0.450–4.500)

## 2024-03-04 LAB — LIPID PANEL
Chol/HDL Ratio: 2.9 ratio (ref 0.0–4.4)
Cholesterol, Total: 189 mg/dL (ref 100–199)
HDL: 65 mg/dL
LDL Chol Calc (NIH): 109 mg/dL — ABNORMAL HIGH (ref 0–99)
Triglycerides: 80 mg/dL (ref 0–149)
VLDL Cholesterol Cal: 15 mg/dL (ref 5–40)

## 2024-03-10 ENCOUNTER — Telehealth: Payer: Self-pay | Admitting: Oncology

## 2024-03-10 NOTE — Telephone Encounter (Signed)
 Due to weather, pt called to r/s appts from 2/2 to another day. Per megan, okay to DB on 2/9. New date and time confirmed with pt

## 2024-03-11 ENCOUNTER — Ambulatory Visit: Payer: Self-pay | Admitting: Family Medicine

## 2024-03-11 NOTE — Progress Notes (Signed)
 Spoke with pt, will call back later to discuss results. Ok for E2C2 to advise pt on results.

## 2024-03-14 ENCOUNTER — Inpatient Hospital Stay: Admitting: Oncology

## 2024-03-14 ENCOUNTER — Inpatient Hospital Stay

## 2024-03-21 ENCOUNTER — Inpatient Hospital Stay: Admitting: Oncology

## 2024-03-21 ENCOUNTER — Inpatient Hospital Stay: Attending: Oncology

## 2024-03-30 ENCOUNTER — Ambulatory Visit

## 2024-03-31 ENCOUNTER — Ambulatory Visit

## 2024-06-07 ENCOUNTER — Ambulatory Visit: Admitting: Family Medicine

## 2024-12-28 ENCOUNTER — Ambulatory Visit
